# Patient Record
Sex: Male | Born: 1935 | Race: White | Hispanic: No | Marital: Married | Smoking: Never smoker
Health system: Southern US, Community
[De-identification: ages and names within clinical notes are randomized; demographics above are authoritative.]

## PROBLEM LIST (undated history)

## (undated) DIAGNOSIS — G252 Other specified forms of tremor: Secondary | ICD-10-CM

## (undated) DIAGNOSIS — F319 Bipolar disorder, unspecified: Secondary | ICD-10-CM

## (undated) DIAGNOSIS — R259 Unspecified abnormal involuntary movements: Principal | ICD-10-CM

## (undated) DIAGNOSIS — E039 Hypothyroidism, unspecified: Secondary | ICD-10-CM

## (undated) DIAGNOSIS — R413 Other amnesia: Secondary | ICD-10-CM

## (undated) DIAGNOSIS — I6350 Cerebral infarction due to unspecified occlusion or stenosis of unspecified cerebral artery: Secondary | ICD-10-CM

## (undated) DIAGNOSIS — F0390 Unspecified dementia without behavioral disturbance: Secondary | ICD-10-CM

## (undated) DIAGNOSIS — G219 Secondary parkinsonism, unspecified: Secondary | ICD-10-CM

## (undated) DIAGNOSIS — E7800 Pure hypercholesterolemia, unspecified: Secondary | ICD-10-CM

## (undated) HISTORY — DX: Pure hypercholesterolemia, unspecified: E78.00

## (undated) HISTORY — DX: Unspecified dementia, unspecified severity, without behavioral disturbance, psychotic disturbance, mood disturbance, and anxiety: F03.90

## (undated) HISTORY — DX: Secondary parkinsonism, unspecified: G21.9

## (undated) HISTORY — DX: Bipolar disorder, unspecified: F31.9

## (undated) HISTORY — DX: Other specified forms of tremor: G25.2

## (undated) HISTORY — DX: Cerebral infarction due to unspecified occlusion or stenosis of unspecified cerebral artery: I63.50

## (undated) HISTORY — PX: OTHER SURGICAL HISTORY: SHX169

## (undated) HISTORY — DX: Unspecified abnormal involuntary movements: R25.9

## (undated) HISTORY — DX: Hypothyroidism, unspecified: E03.9

## (undated) HISTORY — DX: Other amnesia: R41.3

---

## 1997-10-04 ENCOUNTER — Encounter (HOSPITAL_COMMUNITY): Admission: RE | Admit: 1997-10-04 | Discharge: 1998-01-02 | Payer: Self-pay

## 1997-11-22 ENCOUNTER — Ambulatory Visit (HOSPITAL_COMMUNITY): Admission: RE | Admit: 1997-11-22 | Discharge: 1997-11-22 | Payer: Self-pay | Admitting: *Deleted

## 1999-04-15 ENCOUNTER — Other Ambulatory Visit: Admission: RE | Admit: 1999-04-15 | Discharge: 1999-04-15 | Payer: Self-pay | Admitting: Urology

## 2001-03-30 ENCOUNTER — Encounter: Payer: Self-pay | Admitting: Internal Medicine

## 2001-03-30 ENCOUNTER — Encounter: Admission: RE | Admit: 2001-03-30 | Discharge: 2001-03-30 | Payer: Self-pay | Admitting: Internal Medicine

## 2001-04-15 ENCOUNTER — Encounter: Payer: Self-pay | Admitting: Emergency Medicine

## 2001-04-15 ENCOUNTER — Inpatient Hospital Stay (HOSPITAL_COMMUNITY): Admission: EM | Admit: 2001-04-15 | Discharge: 2001-04-18 | Payer: Self-pay | Admitting: Emergency Medicine

## 2001-04-17 ENCOUNTER — Encounter: Payer: Self-pay | Admitting: Internal Medicine

## 2001-04-28 ENCOUNTER — Encounter (INDEPENDENT_AMBULATORY_CARE_PROVIDER_SITE_OTHER): Payer: Self-pay | Admitting: Specialist

## 2001-04-28 ENCOUNTER — Ambulatory Visit (HOSPITAL_COMMUNITY): Admission: RE | Admit: 2001-04-28 | Discharge: 2001-04-28 | Payer: Self-pay | Admitting: Gastroenterology

## 2001-06-23 ENCOUNTER — Ambulatory Visit (HOSPITAL_COMMUNITY): Admission: RE | Admit: 2001-06-23 | Discharge: 2001-06-23 | Payer: Self-pay | Admitting: Gastroenterology

## 2003-01-01 ENCOUNTER — Inpatient Hospital Stay (HOSPITAL_COMMUNITY): Admission: EM | Admit: 2003-01-01 | Discharge: 2003-01-06 | Payer: Self-pay | Admitting: Psychiatry

## 2003-07-17 ENCOUNTER — Emergency Department (HOSPITAL_COMMUNITY): Admission: EM | Admit: 2003-07-17 | Discharge: 2003-07-17 | Payer: Self-pay | Admitting: Family Medicine

## 2003-08-16 ENCOUNTER — Ambulatory Visit (HOSPITAL_COMMUNITY): Admission: RE | Admit: 2003-08-16 | Discharge: 2003-08-16 | Payer: Self-pay | Admitting: Gastroenterology

## 2003-12-31 ENCOUNTER — Encounter: Admission: RE | Admit: 2003-12-31 | Discharge: 2003-12-31 | Payer: Self-pay | Admitting: Internal Medicine

## 2004-03-02 ENCOUNTER — Ambulatory Visit (HOSPITAL_COMMUNITY): Admission: RE | Admit: 2004-03-02 | Discharge: 2004-03-02 | Payer: Self-pay | Admitting: Gastroenterology

## 2005-05-20 ENCOUNTER — Ambulatory Visit (HOSPITAL_COMMUNITY): Admission: RE | Admit: 2005-05-20 | Discharge: 2005-05-20 | Payer: Self-pay | Admitting: Gastroenterology

## 2007-03-20 ENCOUNTER — Encounter: Admission: RE | Admit: 2007-03-20 | Discharge: 2007-03-20 | Payer: Self-pay | Admitting: Internal Medicine

## 2007-09-13 ENCOUNTER — Encounter: Admission: RE | Admit: 2007-09-13 | Discharge: 2007-09-13 | Payer: Self-pay | Admitting: Neurology

## 2010-11-06 NOTE — Discharge Summary (Signed)
[facility]  Patient:    Fernando Ruiz, Fernando Ruiz Visit Number: [id number] MRN: [id number]          Service Type: MED Location: [phone] Attending Physician:  Fernando Ruiz Dictated by:   Fernando Ruiz, M.D. Admit Date:  04/15/2001 Disc. Date: 04/18/01   CC:         Fernando Ruiz, M.D.  Fernando Ruiz, M.D.   Discharge Summary  DISCHARGE DIAGNOSES: 1. Lithium toxicity. 2. Acute renal failure on top of chronic renal insufficiency. 3. Bipolar disorder. 4. Hypertension. 5. Dysphagia with potential esophageal stricture. 6. Abnormal weight loss, possibly secondary to dysphagia. 7. Hypothyroidism.  DISCHARGE MEDICATIONS: 1. Plendil 2.5 mg p.o. q.d. for blood pressure. 2. Zyprexa 5 mg p.o. q.h.s. for bipolar disorder. 3. Synthroid 50 mcg p.o. q.d. for hypothyroidism. 4. Aspirin 81 mg p.o. q.d.  ACTIVITY:  Ambulate with assistance until steady on feet.  DIET:  Low salt.  WOUND CARE:  Not applicable.  SPECIAL INSTRUCTIONS:  The patient is to call Dr. Felipa Ruiz if he has any recurrent nausea or abdominal pain.  He is to follow up with Dr. Dub Ruiz or other psychiatrist for new patient evaluation for his bipolar disorder -- given that he was previously a patient of Dr. Hortencia Ruiz (who no longer takes his medical insurance).  FOLLOW-UP:  Two to three weeks with Dr. Felipa Ruiz, for which the patient and his wife is to call to make the appointment.  At that time he will need a BMET, lithium level.  LABORATORY DATA:  CBC (April 16, 2001) -- White blood cell count 9.9, hemoglobin 11.0, hematocrit 32%, platelet count 237.  ABG (on admission) -- pH 7.39, pCO2 38, pO2 67, bicarb 23, oxygen saturation 93% on room air. CHEM-7 (April 18, 2001) -- Sodium 150, potassium 4.7, chloride 121, serum CO2 21, BUN 16, creatinine 1.8, glucose 94.  (NOTE:  Sodium on April 17, 2001 was 145).  On admission the patients BUN was 19 and creatinine 2.0.  On admission amylase  135, lipase 56, AST 24, ALT 21, alkaline phosphatase 115, total bilirubin 1.6.  Recent calcium 10.1.  Urinalysis unremarkable for infection.  Blood cultures unremarkable for infection. Lithium level on April 17, 2001 was 1.75, with normal being 0.8 to 1.4. Lithium level on admission was elevated at 2.9, with same lab values as recorded above.  CHEST X-RAY:  (On admission)  Revealed tortuosity of the thoracic aorta, with atherosclerotic calcification of the arch.  Heart size is within normal limits.  Vague density in the right chest; believed to be superimposed to rib shadows, probable emphysema.  HISTORY OF PRESENT ILLNESS:  This is a 77 year old Caucasian male who has a history of bipolar disorder, followed by Dr. Hortencia Ruiz on lithium for numerous years.  He recently was initiated on Synthroid 15 mcg each day, earlier in October 2002 for newly diagnosed hyperthyroidism with an elevated TSH.  He also has a long history of hypertension, having been placed on Altace at 2.5 mg p.o. q.d. within the last one to two years.  The patient presented to the emergency room on April 15, 2001 with intractable nausea and vomiting and dehydration, lasting approximately three days.  He was initiated on intravenous fluids, which resulted in significant improvement in his symptomatology.  Lithium level was 2.9 and he was admitted for a lithium toxicity.  HOSPITAL COURSE:  The patient was administered IV fluids with significant resolution of his nausea, vomiting and abdominal pain; along withholding  his lithium treatment.  Confusion seen on admission quickly resolved.  ACE inhibitors also helped, given some mild elevation in his creatinine above baseline.  Baseline creatinine is known to be between 1.7 and 1.8.  On admission creatinine was 2.0.  There is no evidence of infection on admission. Over the next two to three days, all the patients symptomatology resolved. He was eating lunch on the day of  discharge without nausea or vomiting. Discussion with the patients wife indicates they are planning to call Dr. Dub Ruiz for a new patient evaluation, and prefer the follow up and management of his bipolar disorder.  The patient is also pending upper endoscopy with esophageal dilatation on April 28, 2001 with Dr. Madilyn Ruiz of gastroenterology. Prior to discharge the patients renal parameters stabilized at his normal baseline.  Given admission for lithium toxicity, possibly in the setting of recent initiation of Synthroid and longstanding use of ACE inhibitor, lithium was not restarted in place of Zyprexa at 5 mg p.o. q.h.s.  After a long discussion with the patients wife, it was decided that we would allow the new psychiatrist to determine whether or not to continue Zyprexa, in light of its significant expense versus reinitiating lithium therapy at a lower dose.  Of note, lithium dose prior to admission was 300 mg in the morning and 600 mg in the evening. Dictated by:   Fernando Ruiz, M.D. Attending Physician:  Fernando Ruiz DD:  04/18/01 TD:  04/18/01 Job: [id number]

## 2010-11-06 NOTE — Op Note (Signed)
[facility]  Patient:    Fernando Ruiz, Fernando Ruiz Visit Number: [id number] MRN: [id number]          Service Type: END Location: ENDO Attending Physician:  Louie Bun Dictated by:   Everardo All Madilyn Fireman, M.D. Proc. Date: 06/23/01 Admit Date:  06/23/2001   CC:         Ravi R. Felipa Eth, M.D.   Operative Report  PROCEDURE:  Esophagogastroduodenoscopy with esophageal dilatation.  INDICATION:  Solid food dysphagia suggestive of lower esophageal ring stricture.  DESCRIPTION OF PROCEDURE:  The patient was placed in the left lateral decubitus position on the pulse monitor with continuous low flow oxygen delivered by nasal cannula. He was sedated with 70 mg IV Demerol and 5.5 mg IV Versed. The Olympus video endoscope was advanced under direct vision to the oropharynx and the esophagus. The esophagus was straightened and of normal caliber at the squamocolumnar line at 38 cm. There was a lower esophageal ring that was not seen initially but when the scope was withdrawn for dilatation, it was clearly visible. There was no obvious hiatal hernia, stricture, Barretts esophagus or another abnormality to the GE junction. The stomach was entered and a small amount of liquid secretions were suctioned from the fundus. Retroflex view of the cardia was unremarkable. The fundus, body, antrum, and pylorus all appeared normal. The duodenum was entered and both bulb and second portion were well inspected and appeared to be within normal limits. Savary guidewire was placed through the endoscope channel and the scope withdrawn. Savary dilators of 16 mm and 17 mm diameter were passed with minimal resistance on the last dilator and a small amount of blood was seen on withdrawal. The patient was then returned to the recovery room in stable condition. He tolerated the procedure well and there were no immediate complications.  IMPRESSION:  Lower esophageal ring dilated to 17 mm.  PLAN:   Advance diet and observe response to dilatation. Dictated by:   Everardo All Madilyn Fireman, M.D. Attending Physician:  Louie Bun DD:  06/23/01 TD:  06/23/01 Job: [id number]

## 2010-11-06 NOTE — Op Note (Signed)
NAME:  Fernando Ruiz, Fernando Ruiz                          ACCOUNT NO.:  [id number]   MEDICAL RECORD NO.:  [id number]                   PATIENT TYPE:  AMB   LOCATION:  ENDO                                 [facility]ACILITY[facility]  [facility]   PHYSICIAN:  John C. Madilyn Fireman, M.D.                 DATE OF BIRTH:  06-29-1930   DATE OF PROCEDURE:  03/02/2004  DATE OF DISCHARGE:                                 OPERATIVE REPORT   PROCEDURE:  Esophagogastroduodenoscopy.   INDICATION FOR PROCEDURE:  Dysphagia with a known esophageal ring responding  to dilatation in the past.   DESCRIPTION OF PROCEDURE:  The patient was placed in the left lateral  decubitus position and placed on the pulse monitor with continuous low-flow  oxygen delivered by nasal cannula.  He was sedated with 75 mcg IV fentanyl  and 7 mg IV Versed.  The Olympus video endoscope was advanced under direct  vision into the oropharynx and esophagus.  The esophagus was straight and of  normal caliber with the squamocolumnar line at 38 cm above a small hiatal  hernia.  There was a well circumscribed ring at the lower esophageal  sphincter with no resistance to passage of the scope beyond it.  The stomach  was entered, and a small amount of liquid secretions were suctioned from the  fundus.  Retroflexed view of the cardia was unremarkable.  The fundus, body,  antrum, and pylorus all appeared normal.  The duodenum was entered, and both  the bulb and second portion were well-inspected and appeared to be within  normal limits.  A Savary dilator was passed through the endoscope channel  and the scope withdrawn.  Savary dilators of 18 and 19 mm were passed over  the guidewire with minimal resistance and no blood seen on withdrawal.  The  last dilator was removed together with the wire, and the patient returned to  the recovery room in stable condition.  He tolerated the procedure well, and  there were no immediate complications.   IMPRESSION:  Lower esophageal ring,  status post dilatation to 19 mm.   PLAN:  Advance diet and observe response to dilatation.                                               John C. Madilyn Fireman, M.D.    JCH/MEDQ  D:  03/02/2004  T:  03/02/2004  Job:  [id number]   cc:   Larina Earthly, M.D.  [address]  [place]  [place] [address]  Fax: [phone]

## 2010-11-06 NOTE — H&P (Signed)
NAME:  Fernando Ruiz, Fernando Ruiz NO.:  [id number]   MEDICAL RECORD NO.:  [id number]                   PATIENT TYPE:  IPS   LOCATION:  0403                                 FACILITY:  [facility]   PHYSICIAN:  Geoffery Lyons, M.D.                   DATE OF BIRTH:  06/09/1931   DATE OF ADMISSION:  01/01/2003  DATE OF DISCHARGE:                         PSYCHIATRIC ADMISSION ASSESSMENT   IDENTIFYING INFORMATION:  This is a 77 year old white male who is married.  This is a voluntary admission.   HISTORY OF PRESENT ILLNESS:  This patient was referred by his primary care  practitioner today for manic behavior.  The patient describes that he has  lost 20 or 30 pounds since February, had progressive problems with not being  able to sleep at night, and describes himself as a perfectionist.  He most  recently reports that he has been unable to handle his wife, and he seems  quite irritable talking about her today, and it becomes clear, through the  conversation and his rapid thought process, that he had left home apparently  for two or three days at a time twice in the past one week.  He has also  apparently been arguing with his wife and also talks about how much he has  been riding his bike recently.  He reports he is unable to sleep, but the  rest of his history is somewhat disconnected and disorganized.  He is  generally an unreliable historian, and we have been unable to contact his  wife this morning.  He denies any suicidal or homicidal ideations when asked  directly.  At the time of admission, patient presented himself to the  hospital in referral from his primary care physician, whom he had phoned  about his behavior.  He was accompanied by his wife and was generally manic  and disorganized throughout the assessment process.  He told a story of  driving [place] [address] to see a friend and being pulled over by police and  getting lost repeatedly.  Today, his speech is  pressured, he is hyperverbal  and disorganized and tangential in speech.   PAST PSYCHIATRIC HISTORY:  Patient is followed by Dr. [name] since 2002.  This is his first admission to [facility] with his  last admission being for mania approximately 15 years ago by his own report,  and that was at [facility].  Patient reports more than seven admissions  in the distant past for mania to [facility] system throughout his  entire life.  In the past, he had taken lithium for the longest period of  time, which he reports controlled his mania very well, took this for more  than 20 years and then developed some toxicity in October 2002 along with  some mild renal insufficiency and apparently has been off it since that  time  and has not restarted any other medications.  He denies ever taking  Depakote, and the only medication he has been taking recently is a vitamin D  pill.  Prior suicidal thought or attempts are not clear.   SOCIAL HISTORY:  Patient is currently married and lives with his wife here  in [place].  He is retired from full-time employment, and the nature of  his work is not clear, although it seems that he had worked for a [employer].  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  He has no  current legal charges.   FAMILY HISTORY:  Not clear.   ALCOHOL AND DRUG HISTORY:  Patient denies.   PAST MEDICAL HISTORY:  Patient is followed by Dr. Felipa Eth for internal medicine  concerns.  Medical problems have included:  1. Some mild renal insufficiency.  2. Hypertension.  3. Past medical history, as previously noted, patient has a history of     lithium toxicity, diagnosed in October 2002, for which he was     hospitalized at that time.  4. He also apparently has a history of an esophageal stricture and has had     previous dilatation procedures.   MEDICATIONS:  1. Vitamin D.  2. A blood pressure pill.  3. A sleeping pill.  Patient  is unable to name his medications.  A telephone call to Dr. Vicente Males  office notes that patient's medications have been faxed over, and they are:  Klonopin 0.5 mg one to two tablets at h.s.; vitamin D, dose unclear;  Prevacid 30 mg daily; ASA 81 mg daily; and Altace 2.5 mg daily.   DRUG ALLERGIES:  1. PENICILLIN.  2. REMERON has apparently caused him in the past to have frequent falls and     made him feel drunk.   REVIEW OF SYSTEMS:  Today, review of systems is remarkable for the patient  reporting a 20- to 30-pound weight loss sine February.  He reports his  baseline weight being approximately 188 pounds.  He reports being constantly  hyper with a poor appetite and broken sleep.   POSITIVE PHYSICAL FINDINGS:  GENERAL:  This is a thin, almost cachectic-  appearing white male who is in no acute distress.  VITAL SIGNS:  His vital signs at the time of admission were a temp of a  97.8, pulse 76, respirations 20, blood pressure 148/95.  This morning, he  weighs 125 pounds and is exactly 5 feet 7-1/2 inches tall for a BMI of 19.5.  SKIN:  Pale in tone.  He does have some superficial scratches on his left  lower leg, some on the inner aspect of his right leg; these appear to be  healing, possibly just from [place] or walking.  HEENT:  Head is normocephalic and atraumatic.  EENT, PERRLA.  Patient does  wear corrective lenses.  Sclerae are nonicteric.  No rhinorrhea.  Oropharynx  not injected.  The patient does have an upper plate of dentures in place.  NECK:  Supple.  No thyromegaly.  CARDIOVASCULAR:  S1 and S2 are heard.  No clicks, murmurs, or gallops.  LUNGS:  Clear to auscultation.  ABDOMEN:  Flat, soft, nontender.  No mass is appreciated.  GENITALIA:  Deferred.  MUSCULOSKELETAL:  No swelling or erythema of any joints.  NEURO:  EOMs are intact.  No nystagmus is noted.  Patient's balance is good. He is unable to hold still for a full adequate cranial nerve evaluation; he  is unable to  be still enough to follow commands.  His gait is steady.  Motor  movements smooth.  Sensory grossly intact.  Grip strength equal bilaterally.  Facial symmetry is present.  No focal findings are noted.   LABORATORY DATA:  Diagnostic studies were actually mostly within normal  limits.  His creatinine was 1.7 and BUN 25.  Electrolytes were normal.  CBC  was normal.  Thyroid panel is pending.  The patient's SGOT is 81, SGPT is  57; other liver enzymes within normal limits.   MENTAL STATUS EXAM:  This is a fully alert male with a mildly anxious affect  and hyper motor behavior.  He is able to sit still for a few minutes but  then has to get up and move about the room.  His speech is mildly pressured  and hyperverbal.  He prefers to be pacing up and down the hall.  He is  directible with some effort and some caution because he is quite irritable.  Mood is anxious and irritable.  Thought process reflects flight of ideas,  tangential thinking.  He is quite disorganized with rapid change in the  thoughts, and he is unable to keep track of the conversation.  His thinking  is fragmented.  No evidence of overt hallucinations.  Suicidal or homicidal  ideation not present.  Cognitively, he is intact and oriented times three.  Intelligence is average to above average.  Insight poor.  Judgment and  impulse control are impaired.    DIAGNOSES:   AXIS I:  Bipolar disorder, manic.   AXIS II:  No diagnosis.   AXIS III:  1. Elevated liver enzymes.  2. Elevated blood pressure.  3. Mild renal insufficiency.   AXIS IV:  Patient apparently does admit to having a supportive wife, and  this is an asset to him.   AXIS V:  1. Current 16.  2. Past year 72.   PLAN:  To voluntarily admit the patient with every 15-minute checks in  place.  We have placed him on our intensive care program.  We have elected  to start him on Zyprexa 2.5 mg p.o. b.i.d. and 5 mg p.o. q.h.s., and we will  avoid lithium because  of his history of toxicity and mild renal  insufficiency.  Meanwhile, we will restart his routine meds, according to  Dr. Vicente Males list, and monitor his blood pressure closely, and we will recheck  a BMET on him in light of his mild renal insufficiency.  Meanwhile, we are  going to start him also on Klonopin 0.5 mg p.o. q.a.m. and 1 mg p.o. q.h.s.,  and we will weigh him daily.  We are also going to do repeat liver enzymes  and hepatitis panel.   ESTIMATED LENGTH OF STAY:  Five days.     Margaret A. Scott, N.P.                   Geoffery Lyons, M.D.    MAS/MEDQ  D:  01/03/2003  T:  01/05/2003  Job:  [id number]

## 2010-11-06 NOTE — Procedures (Signed)
[facility]  Patient:    Fernando Ruiz, Fernando Ruiz Visit Number: [id number] MRN: [id number]          Service Type: END Location: ENDO Attending Physician:  Louie Bun Dictated by:   Everardo All Madilyn Fireman, M.D. Proc. Date: 04/28/01 Admit Date:  04/28/2001   CC:         Ravi R. Felipa Eth, M.D.   Procedure Report  PROCEDURE:  Colonoscopy.  SURGEON:  John C. Madilyn Fireman, M.D.  INDICATIONS FOR PROCEDURE:  Screening colonoscopy with associated weight loss in a patient also undergoing EGD due to dysphagia.  DESCRIPTION OF PROCEDURE:  The patient was placed in the left lateral decubitus position and placed on the pulse monitor with continuous low flow oxygen delivered by nasal cannula.  He was sedated with 70 mg of IV Demerol and 7 mg of IV Versed for the previous EGD, and no further sedation was required for this procedure.  The Olympus video colonoscope was inserted into the rectum and advanced to the cecum, confirmed by transillumination at McBurneys point, and visualization of the ileocecal valve and appendiceal orifice.  The prep was fairly good.  The cecum, ascending, transverse, and descending colon all appeared normal with no masses, polyps, diverticula, or other mucosal abnormalities, though within the sigmoid colon, there were seen a few scattered diverticula, and no other abnormalities.  The rectum appeared normal.  On retroflexed view, the anus revealed small internal hemorrhoids. The colonoscope was then withdrawn and the patient returned to the recovery room in stable condition.  He tolerated the procedure well and there were no immediate complications.  IMPRESSION:  Left-sided diverticulosis, otherwise normal colonoscopy. Dictated by:   Everardo All Madilyn Fireman, M.D. Attending Physician:  Louie Bun DD:  04/28/01 TD:  04/30/01 Job: [id number]

## 2010-11-06 NOTE — Op Note (Signed)
NAME:  Fernando Ruiz, Fernando Ruiz                ACCOUNT NO.:  [id number]   MEDICAL RECORD NO.:  [id number]          PATIENT TYPE:  AMB   LOCATION:  ENDO                         FACILITY[facility]  [facility]   PHYSICIAN:  John C. Madilyn Fireman, M.D.    DATE OF BIRTH:  1931-01-15   DATE OF PROCEDURE:  05/20/2005  DATE OF DISCHARGE:                                 OPERATIVE REPORT   PROCEDURE:  Esophagogastroduodenoscopy with esophageal dilatation.   INDICATIONS FOR PROCEDURE:  Recurrent solid food dysphagia with history of  lower esophageal ring, symptom response to dilatation to 19 mm 1 year ago.   DESCRIPTION OF PROCEDURE:  The patient was placed in the left lateral  decubitus position then placed on the pulse monitor with continuous low-flow  oxygen delivered by nasal cannula. He was sedated with 75 mcg IV fentanyl  and 7.5 mg IV Versed. The Olympus video endoscope was advanced under direct  vision into the oropharynx and esophagus. The esophagus was straight and of  normal caliber with the squamocolumnar line at 38 cm. There was a widely  patent lower esophageal ring that could only be seen when the LES was fully  relaxed. There was no visible esophagitis and no definite hiatal hernia. The  stomach was entered and a small amount of liquid secretions were suctioned  from the fundus. Retroflexed view of the cardia was unremarkable. The  fundus, body, antrum and pylorus all appeared normal. The duodenum was  entered and both the bulb and second portion were well inspected and  appeared to be within normal limits. The Savary guidewire was placed through  the endoscope channel and the scope withdrawn. Savary dilators of 19 and 20  mm were passed over the guidewire with mild resistance and no blood seen on  withdrawal. The last dilator was removed together with wire and the patient  returned to the recovery room in stable condition. He tolerated the  procedure well and there were no immediate complications.   IMPRESSION:  Widely patent lower esophageal ring dilated to 20 mm.   PLAN:  Will advance diet and observe response to dilatation.           ______________________________  Everardo All Madilyn Fireman, M.D.     JCH/MEDQ  D:  05/20/2005  T:  05/20/2005  Job:  [id number]   cc:   Larina Earthly, M.D.  Fax: [phone]

## 2010-11-06 NOTE — H&P (Signed)
[place]  Patient:    Fernando Ruiz, Fernando Ruiz Visit Number: [id number] MRN: [id number]          Service Type: MED Location: [id number] Attending Physician:  Hoyle Sauer Dictated by:   Gwen Pounds, M.D. Admit Date:  04/15/2001   CC:         Ravi R. Felipa Eth, M.D.  Denman George, M.D.   History and Physical  DATE OF BIRTH:  [birth year]  CHIEF COMPLAINT:  Nausea, vomiting, and Lithium toxicity.  HISTORY OF PRESENT ILLNESS:  This is a 77 year old male with bipolar disorder on Lithium, who came into the emergency department with three days of intractable nausea and vomiting and dehydration.  After arriving at the ED, he currently feels a lot better, still with some mild confusion.  He is to be started on an IV.  Workup has so far been negative.  His belly is relatively benign.  I was called to come admit him with a Lithium level of 2.9.  PAST MEDICAL HISTORY: 1. Bipolar disorder. 2. Hypothyroid. 3. Hypertension. 4. History of long-standing tobacco use, now quit.  MEDICATION LIST: 1. Lithium. 2. Synthroid 50. 3. Altace 2.5.  ALLERGIES:  PENICILLIN.  SOCIAL HISTORY:  He lives with his wife for 43 years, retired from [employer].  He has one child.  He quit tobacco five years ago.  No alcohol.  FAMILY HISTORY:  Brother with head and neck cancer.  Mother died at the age of 24 of stomach cancer.  REVIEW OF SYSTEMS:  He denies any fever or chills.  He has been having nausea and vomiting and abdominal soreness, but no true abdominal pain.  It is diffuse.  He normally has some diarrhea and some GI issues.  He has recently seen Dr. Madilyn Fireman from gastroenterology, who has him planned to do an endoscopy and dilatation soon.  His last manic episode was in 1988 for which he needed to be hospitalized.  He was a little confused prior to starting the nausea and vomiting.  He denies melena and bright red blood per rectum, hematemesis, chest pain,  shortness of breath.  He denies being hard of hearing.  He does wear glasses.  He is currently a little bit weak.  He recently started on Synthroid approximately three weeks ago.  He denies diabetes.  He wears an upper plate.  He lost 14 pounds from May to October.  Other organ systems are negative.  PHYSICAL EXAMINATION:  VITAL SIGNS:  Temperature 98.5, blood pressure 113/56, heart rate 62, respiratory rate 20, saturation 97% room air.  GENERAL:  Alert and oriented x 3 with prompting, otherwise is mildly confused.  HEENT:  PERRL, EOMI, oropharynx is very dry.  NECK:  Supple.  No JVD.  SKIN:  Pale, no lymphadenopathy.  PULMONARY:  Clear to auscultation bilaterally.  CARDIAC:  Regular without murmurs.  ABDOMEN:  Thin, soft, nontender, nondistended, bowel sounds positive.  No rebound, no guarding.  EXTREMITIES:  No clubbing, no cyanosis, no edema.  Dorsalis pedis pulses 2+. Strength intact.  LABORATORY:  Urinalysis is negative.  White blood cell count is 10,000 with 86% segs, hemoglobin 11.5, platelet count 261.  Sodium 139, potassium 5.0, chloride 109, bicarb 23, BUN 19, creatinine 2.0, glucose 91.  The pH is 7.46, PCO2 30, PO2 67.  EKG shows normal sinus rhythm and plenty of artifact, leaving me to say that he has got nonspecific ST and T-wave changes.  Acute abdominal series  shows mild emphysematous changes, nothing acute noted in his abdomen.  His Lithium level was 2.9, upper limit of normal is 1.4.  IMPRESSION:  This is a 77 year old male who is relatively thin with a recent weight loss, who has been in his usual state of health until recently, when he developed three days of intractable nausea and vomiting, along with pretty significant dehydration.  He presented to the emergency department with renal insufficiency and Lithium toxicity.  PLAN:  1. Admit to Dr. Felipa Eth.  2. Telemetry.  3. Aggressively hydrate.  4. Hold Lithium.  5. Phenergan p.r.n.  6. Continue the  Synthroid.  7. Hold Altace secondary to the renal insufficiency.  8. Find out his baseline creatinine.  Hopefully his creatinine returns to     normal without aggressive hydration.  9. Check LFTs. 10. Check amylase and lipase. 11. Check blood cultures. 12. Consult psych when doing better if necessary. 13. Early emphysematous changes on chest x-ray.  It is a good thing that he     stopped tobacco when he did. Dictated by:   Gwen Pounds, M.D. Attending Physician:  Hoyle Sauer DD:  04/15/01 TD:  04/16/01 Job: [id number]

## 2010-11-06 NOTE — Discharge Summary (Signed)
NAME:  Fernando Ruiz, Fernando Ruiz                          ACCOUNT NO.:  [id number]   MEDICAL RECORD NO.:  [id number]                   PATIENT TYPE:  IPS   LOCATION:  0403                                 FACILITY:  [facility]   PHYSICIAN:  Geoffery Lyons, M.D.                   DATE OF BIRTH:  08-03-1930   DATE OF ADMISSION:  01/01/2003  DATE OF DISCHARGE:  01/06/2003                                 DISCHARGE SUMMARY   CHIEF COMPLAINT AND PRESENTING ILLNESS:  This was the first admission to  [facility]  for this 77 year old white male, married,  voluntarily admitted.  Referred by primary care physician for manic  behavior.  He left home for 2 or 3 days, twice in the past week, has been  arguing with his wife, riding his bike.  Lost 20-30 pounds since February,  cannot sleep.  Denies suicidal or homicidal ideation.   PAST PSYCHIATRIC HISTORY:  [name] since October 2002.  First time  [facility].  Last admission for mania was 16 years ago at the  [facility].   ALCOHOL AND DRUG HISTORY:  Denies the use or abuse of any substances.   PAST MEDICAL HISTORY:  Mild renal insufficiency, arterial hypertension.   MEDICATIONS:  Blood pressure medication, sleeping medication, Vitamin E.   PHYSICAL EXAMINATION:  Performed, failed to show any acute findings.   MENTAL STATUS EXAM:  Reveals a fully alert, hypermobile, almost cachectic  male.  Hyperverbal, pressured speech.  Mood of anxiety, irritable.  Affect  irritable and anxious.  Thought process circumstantial, disorganized, rapid  pace.  Fragmented thinking.  Cognition well preserved.   ADMISSION DIAGNOSES:   AXIS I:  Bipolar disorder, manic.   AXIS II:  No diagnosis.   AXIS III:  Elevated liver enzymes and elevated blood pressure.   AXIS IV:  Moderate.   AXIS V:  Global assessment of function upon admission 20, highest global  assessment of function in past year 65-70.   LABORATORY DATA:  CBC was within normal  limits.  Blood chemistries were  within normal limits.  SGOT on July 13 81, SGPT 67, on July 16 SGOT 35, SGPT  40.   COURSE IN HOSPITAL:  He was admitted and started on intensive individual and  group psychotherapy.  He was initially given Zyprexa as well as some Ativan  at bedtime.  He was placed on Altace 2.5 mg per day.  He continued to be  prescribed Zyprexa 2.5 twice a day and 5 at night.  Klonopin 0.5 in the  morning and 1 mg at night.  Once he was given the Zyprexa he started  sleeping better.  There was some initial pressured speech, hyperthymic,  responsive, hyper-religious, but started sleeping.  There was some  impulsivity requiring redirection, and expansiveness.  He continued to  settle down.  He was to meet with his wife.  The session with his wife went  well.  He continued to settle down and on July 18 he was much improved, in  full contact with reality, no suicidal ideas, no homicidal ideas, no  delusions, no hallucinations, no aggressive behaviors, no agitation,  sleeping well, increased insight, willing to stay on his medication.   DISCHARGE DIAGNOSES:   AXIS I:  Bipolar disorder, manic.   AXIS II:  No diagnosis.   AXIS III:  Elevated liver enzymes, high blood pressure.   AXIS IV:  Moderate.   AXIS V:  Global assessment of function upon discharge 60.   DISCHARGE MEDICATIONS:  1. Zyprexa 2.5 mg 1 twice a day and Zyprexa Zydis 5 at bedtime.  2. Klonopin 0.5 1 in the morning and 2 at night.  3. Aspirin 81 mg daily.  4. Protonix 40 mg daily.   DISPOSITION:  Follow up with Dr. Kathrynn Running.                                                Geoffery Lyons, M.D.    IL/MEDQ  D:  01/30/2003  T:  01/30/2003  Job:  [id number]

## 2010-11-06 NOTE — Procedures (Signed)
[facility]  Patient:    Fernando Ruiz, Fernando Ruiz Visit Number: [id number] MRN: [id number]          Service Type: END Location: ENDO Attending Physician:  Louie Bun Dictated by:   Everardo All Madilyn Fireman, M.D. Proc. Date: 04/28/01 Admit Date:  04/28/2001   CC:         Ravi R. Felipa Eth, M.D.   Procedure Report  PROCEDURE:  Esophagogastroduodenoscopy with biopsy.  SURGEON:  John C. Madilyn Fireman, M.D.  INDICATIONS FOR PROCEDURE:  Dysphagia and weight loss.  DESCRIPTION OF PROCEDURE:  The patient was placed in the left lateral decubitus position and placed on the pulse monitor with continuous low flow oxygen delivered by nasal cannula.  He was sedated with 70 mg of IV Demerol and 7 mg of IV Versed.  The Olympus video endoscope was advanced under direct vision into the oropharynx and esophagus.  The esophagus was straight and of normal caliber with the squamocolumnar line at 38 cm.  There was diffuse erythema loosely adherent exudate and loose adherent exudate consistent with esophagitis throughout most of the distal esophagus.  Also, just below the squamocolumnar line, the epithelium just beneath it appeared somewhat erythematous and granular, and there was one nodular area that was at least mildly suspicious for neoplasm.  It was biopsied three times.  I elected not to pursue dilatation.  I did not see a stricture or ring.  The stomach was entered and a small amount of liquid secretions were suctioned from the fundus.  Retroflexed view of the cardia was unremarkable and revealed no nodule or mass.  The fundus, body, antrum, and pylorus all appeared normal. The duodenum was entered, and both the bulb and second portion were well-inspected and appeared to be within normal limits.  The scope was then withdrawn and the patient returned to the recovery room in stable condition.  He tolerated the procedure well and there were no immediate complications.  IMPRESSION: 1.  Severe diffuse erosive esophagitis. 2. Nodularity of the gastroesophageal junction, rule out neoplasm.  PLAN:  Await biopsy results and assuming no malignancy found, the patient will need repeat EGD and consideration of dilatation, and repeat biopsy in approximately one to two months after proton pump inhibitor. Dictated by:   Everardo All Madilyn Fireman, M.D. Attending Physician:  Louie Bun DD:  04/28/01 TD:  04/30/01 Job: [id number]

## 2010-11-06 NOTE — Op Note (Signed)
NAME:  Fernando Ruiz, Fernando Ruiz                          ACCOUNT NO.:  [id number]   MEDICAL RECORD NO.:  [id number]                   PATIENT TYPE:  AMB   LOCATION:  ENDO                                 [facility]ACILITY[facility]  [facility]   PHYSICIAN:  John C. Madilyn Fireman, M.D.                 DATE OF BIRTH:  06/27/1930   DATE OF PROCEDURE:  08/16/2003  DATE OF DISCHARGE:                                 OPERATIVE REPORT   PROCEDURE:  Esophagogastroduodenoscopy with esophageal dilatation.   INDICATIONS:  History of lower esophageal ring with recurrent dysphagia.   DESCRIPTION OF PROCEDURE:  The patient was placed in the left lateral  decubitus position and placed on the pulse monitor with continuous low-flow  oxygen delivered by nasal cannula.  He was sedated with 75 mcg IV fentanyl  and 7 mg IV Versed.  The video endoscope was advanced under direct vision  into the oropharynx and esophagus.  The esophagus was straight and of normal  caliber, the squamocolumnar line at 38 cm.  I could not distinctly see any  lower esophageal ring but there were two or three erosions with a small  amount of exudate extending 2-3 cm above the GE junction.  The stomach was  entered and a small amount of liquid secretions were suctioned from the  fundus.  Retroflexed view of the cardia was unremarkable.  The fundus, body,  antrum, and pylorus all appeared normal.  The duodenum was entered and both  bulb and second portion were well inspected and appeared to be within normal  limits.   The Savary guide wire was placed through the endoscope channel and the scope  withdrawn.  Savary dilators, 17 and 18 mm, were passed consecutively over  the guide wire with no blood seen on either dilator.  The last dilator was  removed together with the wire.  The patient returned to the recovery room  in stable condition.  He tolerated the procedure well and there were no  immediate complications.   IMPRESSION:  Presumed lower esophageal ring with mild  esophagitis, status  post dilatation to 18 mm.   PLAN:  Advance diet and observe response to dilatation.  Will treat with  proton pump inhibitor for at least two months.                                               John C. Madilyn Fireman, M.D.    JCH/MEDQ  D:  08/16/2003  T:  08/16/2003  Job:  [id number]   cc:   Larina Earthly, M.D.  [address]  [place]  [place] [address]  Fax: [phone]

## 2010-12-16 ENCOUNTER — Encounter: Payer: Self-pay | Admitting: Podiatry

## 2012-11-01 ENCOUNTER — Ambulatory Visit (INDEPENDENT_AMBULATORY_CARE_PROVIDER_SITE_OTHER): Payer: Medicare Other | Admitting: Nurse Practitioner

## 2012-11-01 ENCOUNTER — Encounter: Payer: Self-pay | Admitting: Nurse Practitioner

## 2012-11-01 VITALS — BP 138/88 | HR 70 | Ht 66.0 in | Wt 147.0 lb

## 2012-11-01 DIAGNOSIS — G219 Secondary parkinsonism, unspecified: Secondary | ICD-10-CM

## 2012-11-01 DIAGNOSIS — F0390 Unspecified dementia without behavioral disturbance: Secondary | ICD-10-CM

## 2012-11-01 DIAGNOSIS — G252 Other specified forms of tremor: Secondary | ICD-10-CM

## 2012-11-01 DIAGNOSIS — R259 Unspecified abnormal involuntary movements: Secondary | ICD-10-CM

## 2012-11-01 DIAGNOSIS — F319 Bipolar disorder, unspecified: Secondary | ICD-10-CM

## 2012-11-01 DIAGNOSIS — I6350 Cerebral infarction due to unspecified occlusion or stenosis of unspecified cerebral artery: Secondary | ICD-10-CM

## 2012-11-01 DIAGNOSIS — E039 Hypothyroidism, unspecified: Secondary | ICD-10-CM

## 2012-11-01 DIAGNOSIS — E7800 Pure hypercholesterolemia, unspecified: Secondary | ICD-10-CM

## 2012-11-01 MED ORDER — DONEPEZIL HCL 10 MG PO TABS: 10.0000 mg | ORAL_TABLET | Freq: Every morning | ORAL | Status: DC

## 2012-11-01 MED ORDER — TRIHEXYPHENIDYL HCL 2 MG PO TABS: 2.0000 mg | ORAL_TABLET | Freq: Every day | ORAL | Status: DC

## 2012-11-01 NOTE — Patient Instructions (Addendum)
Will change Aricept to 10 mg tablet daily with next refill Continue Artane at the current dose Followup in one year or sooner problems arise

## 2012-11-01 NOTE — Progress Notes (Signed)
HPI: The patient is a 79 y.o. year old male who has had memory issues for about five years.  The patient does not do the finances in the home.  The patient does not drive.  The patient is  able to perform own ADL's.  The patient is unable to distribute his own medications.  The patients bladder and bowel are under good control.  There have been no behavioral changes in the last year.   There have been no hallucinations. His memory is currently stable. His appetite and weight are stable. He sleeps well at night. He is seen by Johnella Moloney nurse practitioner for bipolar disorder every 3 months   ROS:  Memory loss, fatigue  Physical Exam General: well developed, well nourished, seated, in no evident distress Head: head normocephalic and atraumatic. Oropharynx benign Neck: supple with no carotid or supraclavicular bruits Cardiovascular: regular rate and rhythm, no murmurs  Neurologic Exam Mental Status: Awake and  alert. MMSE 20/30, missing items in orientation calculation and copying a figure. He did not miss recall items. AFT 4.   Cranial Nerves: Fundoscopic exam reveals sharp disc margins. Pupils equal, briskly reactive to light. Extraocular movements full without nystagmus. Visual fields full to confrontation. Hearing intact and symmetric to finger snap. Facial sensation intact. Face, tongue, palate move normally and symmetrically. Facial dyskinesias noted .  Motor: Normal bulk and tone. Normal strength in all tested extremity muscles. No focal weakness. Mild resting tremor of the left hand, mild cogwheeling of the right wrist Sensory.: intact to touch and pinprick and vibratory.  Coordination: Rapid alternating movements normal in all extremities. Gait and Station: Arises from chair with the use of his arms .  Gait demonstrates shortened stride length but no shuffling, loss of right arm swing. No fragmented turns .  Reflexes: 1+ and symmetric. Toes downgoing.     ASSESSMENT: Secondary  Parkinson's disease, memory loss which is stable, and bipolar disorder     PLAN: Will change Aricept to 10 mg tablet daily with next refill Continue Artane at the current dose Followup in one year or sooner problems arise    Nilda Riggs, GNP-BC APRN

## 2012-11-15 ENCOUNTER — Encounter: Payer: Self-pay | Admitting: Neurology

## 2012-12-16 ENCOUNTER — Other Ambulatory Visit: Payer: Self-pay | Admitting: Neurology

## 2012-12-21 ENCOUNTER — Other Ambulatory Visit: Payer: Self-pay | Admitting: Neurology

## 2013-01-31 ENCOUNTER — Ambulatory Visit
Admission: RE | Admit: 2013-01-31 | Discharge: 2013-01-31 | Disposition: A | Discharge status: Home / Self Care | Payer: Medicare Other | Source: Ambulatory Visit | Attending: Cardiology | Admitting: Cardiology

## 2013-01-31 ENCOUNTER — Other Ambulatory Visit: Payer: Self-pay | Admitting: Cardiology

## 2013-01-31 DIAGNOSIS — R0602 Shortness of breath: Secondary | ICD-10-CM

## 2013-05-09 ENCOUNTER — Telehealth: Payer: Self-pay | Admitting: Neurology

## 2013-05-09 NOTE — Telephone Encounter (Signed)
called patient to reschedule appt per yan, couldn't reach patient, left message to call back and reschedule. °

## 2013-06-18 ENCOUNTER — Encounter: Payer: Self-pay | Admitting: Podiatry

## 2013-06-18 ENCOUNTER — Ambulatory Visit (INDEPENDENT_AMBULATORY_CARE_PROVIDER_SITE_OTHER): Payer: Medicare Other | Admitting: Podiatry

## 2013-06-18 VITALS — BP 126/77 | HR 69 | Resp 12

## 2013-06-18 DIAGNOSIS — M79609 Pain in unspecified limb: Secondary | ICD-10-CM

## 2013-06-18 DIAGNOSIS — B351 Tinea unguium: Secondary | ICD-10-CM

## 2013-06-18 NOTE — Progress Notes (Signed)
Subjective:     Patient ID: Fernando Ruiz, male   DOB: 29-Dec-1930, 79 y.o.   MRN: [id number]  HPI patient has nail disease with pain 1-5 both feet   Review of Systems     Objective:   Physical Exam Neurovascular status unchanged with thick painful nail bed 1-5 both feet    Assessment:     Mycotic nail infection with pain 1-5 both feet    Plan:     Debridement painful nailbeds 1-5 both feet with no iatrogenic bleeding noted

## 2013-07-05 ENCOUNTER — Ambulatory Visit: Payer: Medicare Other | Admitting: Podiatry

## 2013-07-19 ENCOUNTER — Ambulatory Visit
Admission: RE | Admit: 2013-07-19 | Discharge: 2013-07-19 | Disposition: A | Discharge status: Home / Self Care | Payer: Medicare Other | Source: Ambulatory Visit | Attending: Internal Medicine | Admitting: Internal Medicine

## 2013-07-19 ENCOUNTER — Other Ambulatory Visit: Payer: Self-pay | Admitting: Internal Medicine

## 2013-07-19 DIAGNOSIS — M25559 Pain in unspecified hip: Secondary | ICD-10-CM

## 2013-07-26 ENCOUNTER — Other Ambulatory Visit: Payer: Self-pay | Admitting: Dermatology

## 2013-08-17 ENCOUNTER — Telehealth: Payer: Self-pay | Admitting: Neurology

## 2013-08-17 NOTE — Telephone Encounter (Signed)
Pt's wife Vena Austrialeanor called states pt needs a letter stating pt is not able to drive and is under the care of his wife.The letter can [facility], [employer] fax # [phone]. Vena Austrialeanor would like for Dr. Oliva Bustardohmeier's nurse to return her call when this is completed or if you have any ?'s for her.

## 2013-08-20 NOTE — Telephone Encounter (Signed)
Patient's wife is calling stating that she needs a letter written stating that the patient is unable to drive and is under the wife's care. Dr. Vickey Hugerohmeier would you like a letter to be written. Please advise.

## 2013-08-21 NOTE — Telephone Encounter (Signed)
Please write this letter and I will sign . CD

## 2013-08-23 ENCOUNTER — Encounter: Payer: Self-pay | Admitting: Neurology

## 2013-08-23 NOTE — Telephone Encounter (Signed)
Letter has been written, signed and faxed to insurance agency.  Wife also asked for a copy to be mailed to her.

## 2013-09-10 ENCOUNTER — Ambulatory Visit (INDEPENDENT_AMBULATORY_CARE_PROVIDER_SITE_OTHER): Payer: Medicare Other | Admitting: Podiatry

## 2013-09-10 ENCOUNTER — Encounter: Payer: Self-pay | Admitting: Podiatry

## 2013-09-10 VITALS — BP 140/86 | HR 76 | Resp 12

## 2013-09-10 DIAGNOSIS — M79609 Pain in unspecified limb: Secondary | ICD-10-CM

## 2013-09-10 DIAGNOSIS — B351 Tinea unguium: Secondary | ICD-10-CM

## 2013-09-11 NOTE — Progress Notes (Signed)
Subjective:     Patient ID: Fernando Ruiz, male   DOB: 02/03/1931, 80 y.o.   MRN: [id number]  HPI patient presents with thick nails 1-5 both feet that are painful and he cannot cut   Review of Systems     Objective:   Physical Exam Neurovascular status intact with thick incurvated nailbeds that are painful 1-5 both feet    Assessment:     Mycotic nail infection with pain 1-5 both feet    Plan:     Debridement painful nail bed 1-5 both feet with no bleeding noted

## 2013-10-04 ENCOUNTER — Telehealth: Payer: Self-pay | Admitting: Neurology

## 2013-10-04 NOTE — Telephone Encounter (Signed)
I called wife and explained that you can see NP twice then see MD (per Medicare requirements).  (every 3rd visit).  They would see MD after this 5-15 visit).  She verbalized understanding.

## 2013-10-04 NOTE — Telephone Encounter (Signed)
Patient's wife calling to ask why patient's scheduled 10/29/13 appointment is with Darrol Angelarolyn Martin and not Dr. Vickey Hugerohmeier. Patient's wife thought that they are supposed to alternate with Dr. Vickey Hugerohmeier and Eber Jonesarolyn. Please call patient and advise.

## 2013-11-01 ENCOUNTER — Ambulatory Visit: Payer: Medicare Other | Admitting: Nurse Practitioner

## 2013-11-02 ENCOUNTER — Ambulatory Visit (INDEPENDENT_AMBULATORY_CARE_PROVIDER_SITE_OTHER): Payer: Medicare Other | Admitting: Nurse Practitioner

## 2013-11-02 ENCOUNTER — Encounter: Payer: Self-pay | Admitting: Nurse Practitioner

## 2013-11-02 ENCOUNTER — Encounter (INDEPENDENT_AMBULATORY_CARE_PROVIDER_SITE_OTHER): Payer: Self-pay

## 2013-11-02 VITALS — BP 131/69 | HR 65 | Ht 67.0 in | Wt 155.0 lb

## 2013-11-02 DIAGNOSIS — F0390 Unspecified dementia without behavioral disturbance: Secondary | ICD-10-CM

## 2013-11-02 DIAGNOSIS — G252 Other specified forms of tremor: Secondary | ICD-10-CM

## 2013-11-02 DIAGNOSIS — F319 Bipolar disorder, unspecified: Secondary | ICD-10-CM

## 2013-11-02 DIAGNOSIS — G219 Secondary parkinsonism, unspecified: Secondary | ICD-10-CM

## 2013-11-02 DIAGNOSIS — R259 Unspecified abnormal involuntary movements: Secondary | ICD-10-CM

## 2013-11-02 MED ORDER — DONEPEZIL HCL 10 MG PO TABS: 10.0000 mg | ORAL_TABLET | Freq: Every day | ORAL | Status: DC

## 2013-11-02 MED ORDER — TRIHEXYPHENIDYL HCL 2 MG PO TABS: 2.0000 mg | ORAL_TABLET | Freq: Every day | ORAL | Status: DC

## 2013-11-02 NOTE — Patient Instructions (Addendum)
Continue Aricept at current dose will refill Continue Artane at current dose will refill Followup yearly and when necessary Memory score is stable

## 2013-11-02 NOTE — Progress Notes (Signed)
[facility]  PATIENT: Fernando Ruiz DOB: 03/29/1931   REASON FOR VISIT: Followup for dementia and resting tremor    HISTORY OF PRESENT ILLNESS: Fernando Ruiz, 80 year old returns for followup. He was last seen 5/14/ 2014. He has had memory issues for about six years. The patient does not do the finances in the home. The patient does not drive. The patient is able to perform own ADL's. The patient is unable to distribute his own medications. The patients bladder and bowel are under good control. There have been no behavioral changes in the last year. There have been no hallucinations. His memory is currently stable. His Aricept dose was increased at his last visit . His  appetite and weight are stable. He sleeps well at night. He is seen by Fernando Ruiz nurse practitioner for bipolar disorder every 3 months . He returns for followup.    REVIEW OF SYSTEMS: Full 14 system review of systems performed and notable only for those listed, all others are neg:  Constitutional: N/A  Cardiovascular: N/A  Ear/Nose/Throat: N/A  Skin: N/A  Eyes: N/A  Respiratory: N/A  Gastroitestinal: Constipation  Hematology/Lymphatic: N/A  Endocrine: N/A Musculoskeletal:N/A  Allergy/Immunology: N/A  Neurological: Memory loss, tremors  Psychiatric: Depression anxiety  Sleep : Daytime sleepiness   ALLERGIES: Allergies  Allergen Reactions  . Penicillins     HOME MEDICATIONS: Outpatient Prescriptions Prior to Visit  Medication Sig Dispense Refill  . alendronate (FOSAMAX) 70 MG tablet 70 mg once a week.       Marland Kitchen. amLODipine (NORVASC) 2.5 MG tablet 2.5 mg daily.      . clonazePAM (KLONOPIN) 1 MG tablet 1 mg daily.      . divalproex (DEPAKOTE ER) 250 MG 24 hr tablet 250 mg daily.      Marland Kitchen. donepezil (ARICEPT) 10 MG tablet Take 1 tablet (10 mg total) by mouth daily.  90 tablet  3  . finasteride (PROSCAR) 5 MG tablet 5 mg daily.      Marland Kitchen. levothyroxine (SYNTHROID, LEVOTHROID) 50 MCG tablet 50  mcg daily.      Marland Kitchen. MYRBETRIQ 25 MG TB24 tablet       . omeprazole (PRILOSEC) 40 MG capsule 40 mg daily.      . risperiDONE (RISPERDAL) 1 MG tablet 1 mg daily.      . simvastatin (ZOCOR) 40 MG tablet 40 mg daily.      . trihexyphenidyl (ARTANE) 2 MG tablet Take 1 tablet (2 mg total) by mouth daily.  30 tablet  11  . traMADol (ULTRAM) 50 MG tablet        No facility-administered medications prior to visit.    PAST MEDICAL HISTORY: Past Medical History  Diagnosis Date  . Resting tremor   . Memory loss   . Unspecified cerebral artery occlusion with cerebral infarction   . Secondary parkinsonism   . High cholesterol   . Bipolar disorder   . Hypothyroidism   . Dementia     PAST SURGICAL HISTORY: Past Surgical History  Procedure Laterality Date  . None      FAMILY HISTORY: Family History  Problem Relation Age of Onset  . Stroke Father   . Cancer Mother   . Cancer Brother   . Heart disease Sister   . Diabetes Sister     SOCIAL HISTORY: History   Social History  . Marital Status: Married    Spouse Name: N/A    Number of Children: 1  . Years of Education: 12+  Occupational History  . Retired     Social History Main Topics  . Smoking status: Never Smoker   . Smokeless tobacco: Never Used  . Alcohol Use: No  . Drug Use: No  . Sexual Activity: Not on file   Other Topics Concern  . Not on file   Social History Narrative   Patient is married and lives at home with his wife Fernando Ruiz(Fernando Ruiz). Patient has one child and he is retired.     PHYSICAL EXAM  Filed Vitals:   11/02/13 1007  BP: 131/69  Pulse: 65  Height: 5\' 7"  (1.702 m)  Weight: 155 lb (70.308 kg)   Body mass index is 24.27 kg/(m^2). General: well developed, well nourished, seated, in no evident distress  Head: head normocephalic and atraumatic. Oropharynx benign  Neck: supple with no carotid or supraclavicular bruits  Cardiovascular: regular rate and rhythm, no murmurs  Neurologic Exam  Mental  Status: Awake and alert. MMSE 23/30, missing items in orientation calculation and copying a figure. He did miss 3/3 recall items. AFT 3. GDS=1  Cranial Nerves:  Pupils equal, briskly reactive to light. Extraocular movements full without nystagmus. Visual fields full to confrontation. Hearing intact and symmetric to finger snap. Facial sensation intact. Face, tongue, palate move normally and symmetrically. Mild facial dyskinesias noted .  Motor: Normal bulk and tone. Normal strength in all tested extremity muscles. No focal weakness. Mild resting tremor of the left hand, mild cogwheeling of the right wrist  Sensory.: intact to touch and pinprick and vibratory.  Coordination: Rapid alternating movements normal in all extremities.  Gait and Station: Arises from chair with the use of his arms . Gait demonstrates shortened stride length but no shuffling, loss of right arm swing. No fragmented turns .  Reflexes: 1+ and symmetric. Toes downgoing.   DIAGNOSTIC DATA (LABS, IMAGING, TESTING) - ASSESSMENT AND PLAN  80 y.o. year old male  has a past medical history of Resting tremor; Memory loss; Secondary parkinsonism; Bipolar disorder; and Dementia. here to followup.  Continue Aricept at current dose will refill Continue Artane at current dose will refill Followup yearly and when necessary Memory score is stable Fernando Ruiz, [facility] HospitalGNP, [facility] SurgeryBC, APRN  [facility] [address] [place], [place] [address] [phone]

## 2013-12-06 ENCOUNTER — Encounter: Payer: Self-pay | Admitting: Podiatry

## 2013-12-06 ENCOUNTER — Ambulatory Visit (INDEPENDENT_AMBULATORY_CARE_PROVIDER_SITE_OTHER): Payer: Medicare Other | Admitting: Podiatry

## 2013-12-06 VITALS — BP 152/90 | HR 78 | Resp 12

## 2013-12-06 DIAGNOSIS — B351 Tinea unguium: Secondary | ICD-10-CM

## 2013-12-06 DIAGNOSIS — M79673 Pain in unspecified foot: Secondary | ICD-10-CM

## 2013-12-06 DIAGNOSIS — M79609 Pain in unspecified limb: Secondary | ICD-10-CM

## 2013-12-06 NOTE — Progress Notes (Signed)
Subjective:     Patient ID: Fernando Ruiz, male   DOB: 03/05/1931, 80 y.o.   MRN: [id number]  HPI patient presents with thick deformed nailbeds 1-5 both feet that are painful and he cannot cut   Review of Systems     Objective:   Physical Exam Neurovascular status intact with thick yellow brittle nailbeds 1-5 of both feet    Assessment:     Mycotic nail infection with pain 1-5 both feet    Plan:     Debris painful nailbeds 1-5 both feet with no iatrogenic bleeding noted

## 2013-12-13 ENCOUNTER — Ambulatory Visit: Payer: Medicare Other | Admitting: Podiatry

## 2014-03-14 ENCOUNTER — Other Ambulatory Visit: Payer: Medicare Other

## 2014-03-18 ENCOUNTER — Ambulatory Visit (INDEPENDENT_AMBULATORY_CARE_PROVIDER_SITE_OTHER): Payer: Medicare Other | Admitting: Podiatry

## 2014-03-18 DIAGNOSIS — B351 Tinea unguium: Secondary | ICD-10-CM

## 2014-03-18 DIAGNOSIS — M79609 Pain in unspecified limb: Secondary | ICD-10-CM

## 2014-03-18 DIAGNOSIS — M79673 Pain in unspecified foot: Secondary | ICD-10-CM

## 2014-03-18 NOTE — Progress Notes (Signed)
   Subjective:    Patient ID: Fernando Ruiz, male    DOB: 07/01/1930, 80 y.o.   MRN: [id number]  HPI  Pt presents for nail debridement  Review of Systems     Objective:   Physical Exam        Assessment & Plan:

## 2014-03-20 NOTE — Progress Notes (Signed)
Subjective:     Patient ID: Fernando Ruiz, male   DOB: 05/03/1931, 80 y.o.   MRN: [id number]  HPI patient presents with nail disease 1-5 both feet that become painful and he cannot cut   Review of Systems     Objective:   Physical Exam Neurovascular status unchanged with thick yellow brittle nailbeds 1-5 both feet    Assessment:     Mycotic nail infection with pain 1-5 both feet    Plan:     Debridement painful nailbeds 1-5 both feet with no iatrogenic bleeding noted

## 2014-05-01 ENCOUNTER — Telehealth: Payer: Self-pay | Admitting: Neurology

## 2014-05-01 ENCOUNTER — Encounter: Payer: Self-pay | Admitting: Neurology

## 2014-05-01 NOTE — Telephone Encounter (Signed)
Printed and mailed letter with adjusted appointment time on 11/06/14 per Dr Dohmeier's schedule.  °

## 2014-05-08 ENCOUNTER — Encounter: Payer: Self-pay | Admitting: Neurology

## 2014-05-14 ENCOUNTER — Encounter: Payer: Self-pay | Admitting: Neurology

## 2014-05-18 ENCOUNTER — Emergency Department (HOSPITAL_COMMUNITY): Payer: Medicare Other

## 2014-05-18 ENCOUNTER — Inpatient Hospital Stay (HOSPITAL_COMMUNITY)
Admission: EM | Admit: 2014-05-18 | Discharge: 2014-05-28 | DRG: 393 | Disposition: A | Discharge status: Skilled Nursing Facility | Payer: Medicare Other | Attending: Internal Medicine | Admitting: Internal Medicine

## 2014-05-18 ENCOUNTER — Encounter (HOSPITAL_COMMUNITY): Payer: Self-pay | Admitting: *Deleted

## 2014-05-18 DIAGNOSIS — F0390 Unspecified dementia without behavioral disturbance: Secondary | ICD-10-CM | POA: Diagnosis present

## 2014-05-18 DIAGNOSIS — G20 Parkinson's disease: Secondary | ICD-10-CM | POA: Diagnosis present

## 2014-05-18 DIAGNOSIS — N170 Acute kidney failure with tubular necrosis: Secondary | ICD-10-CM | POA: Diagnosis present

## 2014-05-18 DIAGNOSIS — K2210 Ulcer of esophagus without bleeding: Secondary | ICD-10-CM | POA: Clinically undetermined

## 2014-05-18 DIAGNOSIS — R112 Nausea with vomiting, unspecified: Secondary | ICD-10-CM | POA: Diagnosis present

## 2014-05-18 DIAGNOSIS — Z87891 Personal history of nicotine dependence: Secondary | ICD-10-CM

## 2014-05-18 DIAGNOSIS — E860 Dehydration: Secondary | ICD-10-CM | POA: Diagnosis present

## 2014-05-18 DIAGNOSIS — F319 Bipolar disorder, unspecified: Secondary | ICD-10-CM | POA: Diagnosis present

## 2014-05-18 DIAGNOSIS — R1310 Dysphagia, unspecified: Secondary | ICD-10-CM | POA: Diagnosis present

## 2014-05-18 DIAGNOSIS — E870 Hyperosmolality and hypernatremia: Secondary | ICD-10-CM | POA: Insufficient documentation

## 2014-05-18 DIAGNOSIS — Z79899 Other long term (current) drug therapy: Secondary | ICD-10-CM

## 2014-05-18 DIAGNOSIS — K625 Hemorrhage of anus and rectum: Secondary | ICD-10-CM

## 2014-05-18 DIAGNOSIS — N251 Nephrogenic diabetes insipidus: Secondary | ICD-10-CM | POA: Diagnosis present

## 2014-05-18 DIAGNOSIS — X58XXXA Exposure to other specified factors, initial encounter: Secondary | ICD-10-CM | POA: Diagnosis present

## 2014-05-18 DIAGNOSIS — E7800 Pure hypercholesterolemia, unspecified: Secondary | ICD-10-CM | POA: Diagnosis present

## 2014-05-18 DIAGNOSIS — N12 Tubulo-interstitial nephritis, not specified as acute or chronic: Secondary | ICD-10-CM | POA: Diagnosis present

## 2014-05-18 DIAGNOSIS — K5900 Constipation, unspecified: Secondary | ICD-10-CM | POA: Diagnosis present

## 2014-05-18 DIAGNOSIS — E871 Hypo-osmolality and hyponatremia: Secondary | ICD-10-CM | POA: Diagnosis not present

## 2014-05-18 DIAGNOSIS — G219 Secondary parkinsonism, unspecified: Secondary | ICD-10-CM | POA: Diagnosis present

## 2014-05-18 DIAGNOSIS — I129 Hypertensive chronic kidney disease with stage 1 through stage 4 chronic kidney disease, or unspecified chronic kidney disease: Secondary | ICD-10-CM | POA: Diagnosis present

## 2014-05-18 DIAGNOSIS — K222 Esophageal obstruction: Secondary | ICD-10-CM | POA: Diagnosis present

## 2014-05-18 DIAGNOSIS — M810 Age-related osteoporosis without current pathological fracture: Secondary | ICD-10-CM | POA: Diagnosis present

## 2014-05-18 DIAGNOSIS — R109 Unspecified abdominal pain: Secondary | ICD-10-CM

## 2014-05-18 DIAGNOSIS — J189 Pneumonia, unspecified organism: Secondary | ICD-10-CM

## 2014-05-18 DIAGNOSIS — E876 Hypokalemia: Secondary | ICD-10-CM | POA: Diagnosis not present

## 2014-05-18 DIAGNOSIS — E039 Hypothyroidism, unspecified: Secondary | ICD-10-CM | POA: Diagnosis present

## 2014-05-18 DIAGNOSIS — K269 Duodenal ulcer, unspecified as acute or chronic, without hemorrhage or perforation: Secondary | ICD-10-CM | POA: Diagnosis present

## 2014-05-18 DIAGNOSIS — N183 Chronic kidney disease, stage 3 (moderate): Secondary | ICD-10-CM | POA: Diagnosis present

## 2014-05-18 DIAGNOSIS — Z66 Do not resuscitate: Secondary | ICD-10-CM | POA: Diagnosis present

## 2014-05-18 DIAGNOSIS — N390 Urinary tract infection, site not specified: Secondary | ICD-10-CM | POA: Clinically undetermined

## 2014-05-18 DIAGNOSIS — N179 Acute kidney failure, unspecified: Secondary | ICD-10-CM

## 2014-05-18 DIAGNOSIS — T18128A Food in esophagus causing other injury, initial encounter: Principal | ICD-10-CM | POA: Diagnosis present

## 2014-05-18 DIAGNOSIS — D72829 Elevated white blood cell count, unspecified: Secondary | ICD-10-CM | POA: Insufficient documentation

## 2014-05-18 DIAGNOSIS — Z8673 Personal history of transient ischemic attack (TIA), and cerebral infarction without residual deficits: Secondary | ICD-10-CM

## 2014-05-18 DIAGNOSIS — N400 Enlarged prostate without lower urinary tract symptoms: Secondary | ICD-10-CM | POA: Diagnosis present

## 2014-05-18 DIAGNOSIS — E872 Acidosis: Secondary | ICD-10-CM | POA: Diagnosis present

## 2014-05-18 DIAGNOSIS — Z880 Allergy status to penicillin: Secondary | ICD-10-CM

## 2014-05-18 DIAGNOSIS — K8020 Calculus of gallbladder without cholecystitis without obstruction: Secondary | ICD-10-CM | POA: Diagnosis present

## 2014-05-18 DIAGNOSIS — I6350 Cerebral infarction due to unspecified occlusion or stenosis of unspecified cerebral artery: Secondary | ICD-10-CM | POA: Diagnosis present

## 2014-05-18 LAB — COMPREHENSIVE METABOLIC PANEL
ALBUMIN: 4.4 g/dL (ref 3.5–5.2)
ALT: 19 U/L (ref 0–53)
AST: 35 U/L (ref 0–37)
Alkaline Phosphatase: 63 U/L (ref 39–117)
Anion gap: 21 — ABNORMAL HIGH (ref 5–15)
BILIRUBIN TOTAL: 0.6 mg/dL (ref 0.3–1.2)
BUN: 42 mg/dL — AB (ref 6–23)
CALCIUM: 10.5 mg/dL (ref 8.4–10.5)
CO2: 19 mEq/L (ref 19–32)
CREATININE: 3.31 mg/dL — AB (ref 0.50–1.35)
Chloride: 114 mEq/L — ABNORMAL HIGH (ref 96–112)
GFR calc Af Amer: 19 mL/min — ABNORMAL LOW (ref >90)
GFR calc non Af Amer: 16 mL/min — ABNORMAL LOW (ref >90)
Glucose, Bld: 130 mg/dL — ABNORMAL HIGH (ref 70–99)
Potassium: 4.2 mEq/L (ref 3.7–5.3)
Sodium: 154 mEq/L — ABNORMAL HIGH (ref 137–147)
Total Protein: 8.5 g/dL — ABNORMAL HIGH (ref 6.0–8.3)

## 2014-05-18 LAB — URINALYSIS, ROUTINE W REFLEX MICROSCOPIC
BILIRUBIN URINE: NEGATIVE
Glucose, UA: NEGATIVE mg/dL
KETONES UR: 15 mg/dL — AB
Leukocytes, UA: NEGATIVE
NITRITE: NEGATIVE
PH: 6 (ref 5.0–8.0)
Protein, ur: 30 mg/dL — AB
SPECIFIC GRAVITY, URINE: 1.011 (ref 1.005–1.030)
UROBILINOGEN UA: 0.2 mg/dL (ref 0.0–1.0)

## 2014-05-18 LAB — CBC WITH DIFFERENTIAL/PLATELET
BASOS ABS: 0 10*3/uL (ref 0.0–0.1)
BASOS PCT: 0 % (ref 0–1)
EOS ABS: 0 10*3/uL (ref 0.0–0.7)
EOS PCT: 0 % (ref 0–5)
HEMATOCRIT: 42.5 % (ref 39.0–52.0)
Hemoglobin: 13.9 g/dL (ref 13.0–17.0)
Lymphocytes Relative: 4 % — ABNORMAL LOW (ref 12–46)
Lymphs Abs: 0.6 10*3/uL — ABNORMAL LOW (ref 0.7–4.0)
MCH: 31.3 pg (ref 26.0–34.0)
MCHC: 32.7 g/dL (ref 30.0–36.0)
MCV: 95.7 fL (ref 78.0–100.0)
MONO ABS: 0.7 10*3/uL (ref 0.1–1.0)
MONOS PCT: 4 % (ref 3–12)
Neutro Abs: 14.6 10*3/uL — ABNORMAL HIGH (ref 1.7–7.7)
Neutrophils Relative %: 92 % — ABNORMAL HIGH (ref 43–77)
Platelets: 227 10*3/uL (ref 150–400)
RBC: 4.44 MIL/uL (ref 4.22–5.81)
RDW: 13.8 % (ref 11.5–15.5)
WBC: 15.9 10*3/uL — ABNORMAL HIGH (ref 4.0–10.5)

## 2014-05-18 LAB — URINE MICROSCOPIC-ADD ON

## 2014-05-18 LAB — PROTIME-INR
INR: 1.15 (ref 0.00–1.49)
Prothrombin Time: 14.8 seconds (ref 11.6–15.2)

## 2014-05-18 LAB — ABO/RH: ABO/RH(D): O POS

## 2014-05-18 LAB — TYPE AND SCREEN
ABO/RH(D): O POS
Antibody Screen: NEGATIVE

## 2014-05-18 LAB — TROPONIN I: Troponin I: <0.3 ng/mL (ref <0.30)

## 2014-05-18 LAB — POC OCCULT BLOOD, ED: FECAL OCCULT BLD: POSITIVE — AB

## 2014-05-18 LAB — LIPASE, BLOOD: LIPASE: 31 U/L (ref 11–59)

## 2014-05-18 LAB — APTT: aPTT: 30 seconds (ref 24–37)

## 2014-05-18 MED ORDER — DONEPEZIL HCL 10 MG PO TABS
10.0000 mg | ORAL_TABLET | Freq: Every day | ORAL | Status: DC
  Administered 2014-05-20 – 2014-05-28 (×9): 10 mg via ORAL
  Filled 2014-05-18 (×11): qty 1

## 2014-05-18 MED ORDER — DEXTROSE 5 % IV SOLN
Freq: Once | INTRAVENOUS | Status: AC
  Administered 2014-05-18: 16:00:00 via INTRAVENOUS

## 2014-05-18 MED ORDER — ONDANSETRON HCL 4 MG/2ML IJ SOLN
4.0000 mg | Freq: Four times a day (QID) | INTRAMUSCULAR | Status: DC | PRN
  Administered 2014-05-22: 4 mg via INTRAVENOUS
  Filled 2014-05-18 (×2): qty 2

## 2014-05-18 MED ORDER — HYDRALAZINE HCL 20 MG/ML IJ SOLN
5.0000 mg | Freq: Once | INTRAMUSCULAR | Status: AC
  Administered 2014-05-18: 5 mg via INTRAVENOUS
  Filled 2014-05-18: qty 1

## 2014-05-18 MED ORDER — LEVOTHYROXINE SODIUM 100 MCG IV SOLR
25.0000 ug | Freq: Every day | INTRAVENOUS | Status: DC
  Filled 2014-05-18 (×2): qty 5

## 2014-05-18 MED ORDER — DEXTROSE-NACL 5-0.2 % IV SOLN: INTRAVENOUS | Status: DC

## 2014-05-18 MED ORDER — HEPARIN SODIUM (PORCINE) 5000 UNIT/ML IJ SOLN
5000.0000 [IU] | Freq: Three times a day (TID) | INTRAMUSCULAR | Status: DC
  Administered 2014-05-18 – 2014-05-28 (×29): 5000 [IU] via SUBCUTANEOUS
  Filled 2014-05-18 (×38): qty 1

## 2014-05-18 MED ORDER — MIRABEGRON ER 25 MG PO TB24
25.0000 mg | ORAL_TABLET | Freq: Every day | ORAL | Status: DC
  Administered 2014-05-20: 25 mg via ORAL
  Filled 2014-05-18 (×3): qty 1

## 2014-05-18 MED ORDER — FLEET ENEMA 7-19 GM/118ML RE ENEM
1.0000 | ENEMA | Freq: Once | RECTAL | Status: AC | PRN
  Filled 2014-05-18: qty 1

## 2014-05-18 MED ORDER — RISPERIDONE 1 MG PO TABS
1.0000 mg | ORAL_TABLET | Freq: Every day | ORAL | Status: DC
  Administered 2014-05-20: 1 mg via ORAL
  Filled 2014-05-18 (×3): qty 1

## 2014-05-18 MED ORDER — ONDANSETRON HCL 4 MG/2ML IJ SOLN
4.0000 mg | Freq: Once | INTRAMUSCULAR | Status: AC
  Administered 2014-05-18: 4 mg via INTRAVENOUS
  Filled 2014-05-18: qty 2

## 2014-05-18 MED ORDER — DEXTROSE 5 % IV SOLN
INTRAVENOUS | Status: DC
  Administered 2014-05-18: 50 mL via INTRAVENOUS
  Administered 2014-05-20 (×2): via INTRAVENOUS
  Administered 2014-05-21: 1000 mL via INTRAVENOUS
  Administered 2014-05-21 – 2014-05-22 (×2): via INTRAVENOUS

## 2014-05-18 MED ORDER — IOHEXOL 300 MG/ML  SOLN: 25.0000 mL | INTRAMUSCULAR | Status: DC | PRN

## 2014-05-18 MED ORDER — FINASTERIDE 5 MG PO TABS
5.0000 mg | ORAL_TABLET | Freq: Every day | ORAL | Status: DC
  Administered 2014-05-20 – 2014-05-28 (×9): 5 mg via ORAL
  Filled 2014-05-18 (×11): qty 1

## 2014-05-18 MED ORDER — ONDANSETRON HCL 4 MG/2ML IJ SOLN
4.0000 mg | Freq: Four times a day (QID) | INTRAMUSCULAR | Status: DC
  Administered 2014-05-18 – 2014-05-28 (×37): 4 mg via INTRAVENOUS
  Filled 2014-05-18 (×36): qty 2

## 2014-05-18 MED ORDER — ONDANSETRON HCL 4 MG PO TABS: 4.0000 mg | ORAL_TABLET | Freq: Four times a day (QID) | ORAL | Status: DC | PRN

## 2014-05-18 MED ORDER — TRIHEXYPHENIDYL HCL 2 MG PO TABS
2.0000 mg | ORAL_TABLET | Freq: Every day | ORAL | Status: DC
  Administered 2014-05-20: 2 mg via ORAL
  Filled 2014-05-18 (×3): qty 1

## 2014-05-18 MED ORDER — SODIUM CHLORIDE 0.9 % IV SOLN: INTRAVENOUS | Status: DC

## 2014-05-18 MED ORDER — SODIUM CHLORIDE 0.9 % IV BOLUS (SEPSIS)
500.0000 mL | Freq: Once | INTRAVENOUS | Status: AC
  Administered 2014-05-18: 500 mL via INTRAVENOUS

## 2014-05-18 MED ORDER — DEXTROSE 5 % IV SOLN
INTRAVENOUS | Status: DC
  Administered 2014-05-18: 50 mL via INTRAVENOUS
  Administered 2014-05-19: 06:00:00 via INTRAVENOUS

## 2014-05-18 MED ORDER — LORAZEPAM 2 MG/ML IJ SOLN
0.5000 mg | INTRAMUSCULAR | Status: DC | PRN
  Administered 2014-05-19: 0.5 mg via INTRAVENOUS
  Filled 2014-05-18: qty 1

## 2014-05-18 NOTE — Progress Notes (Signed)
Phoned ED to get report, was transferred and no one answered.

## 2014-05-18 NOTE — Progress Notes (Signed)
Patient BP 185/99. Pt with tremors present baseline per family. Schorr, NP notified. Orders placed.

## 2014-05-18 NOTE — ED Provider Notes (Signed)
CSN: [id number]     Arrival date & time 05/18/14  1115 History   First MD Initiated Contact with Patient 05/18/14 1121     Chief Complaint  Patient presents with  . Nausea  . Constipation     (Consider location/radiation/quality/duration/timing/severity/associated sxs/prior Treatment) The history is provided by the patient and medical records.    This is a 80 year old male with past medical history significant for hypothyroidism, hyperlipidemia, bipolar disorder, dementia, prior CVA, presenting to the ED for constipation, nausea, and vomiting since Thursday. Family reports that patient has not had a bowel movement in nearly 3 days which is somewhat irregular for him-- they state he usually has a bowel movement either daily or every other day. States prior to this BM's were regular, no melena or hematochezia.  He does note some lower abdominal discomfort, but he states this is mild. He has been having intermittent nausea and vomiting. States occurs independent of eating. He has been unable to tolerate any PO solids or liquids since this time.  Denies fever, chills, sweats.  No chest pain or SOB.  Patient has had no prior abdominal surgeries.  No known sick contacts.  Patient not currently on anti-coagulants.  Wife reports patient did have colonoscopy and EGD 4 years ago by Dr[facility] states was normal as far as she remember  Past Medical History  Diagnosis Date  . Resting tremor   . Memory loss   . Unspecified cerebral artery occlusion with cerebral infarction   . Secondary parkinsonism   . High cholesterol   . Bipolar disorder   . Hypothyroidism   . Dementia    Past Surgical History  Procedure Laterality Date  . None     Family History  Problem Relation Age of Onset  . Stroke Father   . Cancer Mother   . Cancer Brother   . Heart disease Sister   . Diabetes Sister    History  Substance Use Topics  . Smoking status: Never Smoker   . Smokeless tobacco: Never Used  .  Alcohol Use: No    Review of Systems  Constitutional: Positive for appetite change.  Gastrointestinal: Positive for nausea, vomiting and abdominal pain.  All other systems reviewed and are negative.     Allergies  Penicillins  Home Medications   Prior to Admission medications   Medication Sig Start Date End Date Taking? Authorizing Provider  alendronate (FOSAMAX) 70 MG tablet 70 mg once a week.  10/23/12  Yes Historical Provider, MD  amLODipine (NORVASC) 5 MG tablet Take 5 mg by mouth daily. 04/07/14  Yes Historical Provider, MD  clonazePAM (KLONOPIN) 1 MG tablet Take 1 mg by mouth daily.  10/09/12  Yes Historical Provider, MD  divalproex (DEPAKOTE ER) 250 MG 24 hr tablet Take 250 mg by mouth daily.  10/19/12  Yes Historical Provider, MD  donepezil (ARICEPT) 10 MG tablet Take 1 tablet (10 mg total) by mouth daily. 11/02/13  Yes Nilda RiggsNancy Carolyn Martin, NP  finasteride (PROSCAR) 5 MG tablet Take 5 mg by mouth daily.  10/03/12  Yes Historical Provider, MD  levothyroxine (SYNTHROID, LEVOTHROID) 50 MCG tablet Take 50 mcg by mouth daily.  10/18/12  Yes Historical Provider, MD  MYRBETRIQ 25 MG TB24 tablet Take 25 mg by mouth daily.  07/23/13  Yes Historical Provider, MD  omeprazole (PRILOSEC) 40 MG capsule Take 40 mg by mouth daily.  10/26/12  Yes Historical Provider, MD  risperiDONE (RISPERDAL) 1 MG tablet Take 1 mg by mouth daily.  07/25/12  Yes Historical Provider, MD  simvastatin (ZOCOR) 40 MG tablet Take 40 mg by mouth daily.  10/18/12  Yes Historical Provider, MD  trihexyphenidyl (ARTANE) 2 MG tablet Take 1 tablet (2 mg total) by mouth daily. 11/02/13  Yes Nilda RiggsNancy Carolyn Martin, NP   BP 152/97 mmHg  Pulse 107  Temp(Src) 97.8 F (36.6 C) (Oral)  Resp 20  Ht 5\' 8"  (1.727 m)  Wt 157 lb (71.215 kg)  BMI 23.88 kg/m2  SpO2 100%   Physical Exam  Constitutional: He is oriented to person, place, and time. He appears well-developed and well-nourished.  HENT:  Head: Normocephalic and atraumatic.   Mouth/Throat: Oropharynx is clear and moist.  Dry mucous membranes  Eyes: Conjunctivae and EOM are normal. Pupils are equal, round, and reactive to light.  Neck: Normal range of motion.  Cardiovascular: Normal rate, regular rhythm and normal heart sounds.   Pulmonary/Chest: Effort normal and breath sounds normal. No respiratory distress. He has no wheezes.  Abdominal: Soft. Bowel sounds are normal. There is tenderness in the right lower quadrant and left lower quadrant. There is no guarding.  Abdomen soft, non-distended; mild tenderness of bilateral lower quadrants without rebound or guarding; no peritoneal signs  Genitourinary: Rectal exam shows no external hemorrhoid, no internal hemorrhoid, no fissure, no mass, no tenderness and anal tone normal. Guaiac positive stool.  No external hemorrhoids; normal rectal tone; watery pink stool noted on glove; guaiac +; no fecal impaction or masses noted  Musculoskeletal: Normal range of motion.  Neurological: He is alert and oriented to person, place, and time.  Skin: Skin is warm and dry.  Psychiatric: He has a normal mood and affect.  Nursing note and vitals reviewed.   ED Course  Procedures (including critical care time) Labs Review Labs Reviewed  CBC WITH DIFFERENTIAL - Abnormal; Notable for the following:    WBC 15.9 (*)    Neutrophils Relative % 92 (*)    Neutro Abs 14.6 (*)    Lymphocytes Relative 4 (*)    Lymphs Abs 0.6 (*)    All other components within normal limits  COMPREHENSIVE METABOLIC PANEL - Abnormal; Notable for the following:    Sodium 154 (*)    Chloride 114 (*)    Glucose, Bld 130 (*)    BUN 42 (*)    Creatinine, Ser 3.31 (*)    Total Protein 8.5 (*)    GFR calc non Af Amer 16 (*)    GFR calc Af Amer 19 (*)    Anion gap 21 (*)    All other components within normal limits  POC OCCULT BLOOD, ED - Abnormal; Notable for the following:    Fecal Occult Bld POSITIVE (*)    All other components within normal limits   LIPASE, BLOOD  PROTIME-INR  APTT  TROPONIN I  URINALYSIS, ROUTINE W REFLEX MICROSCOPIC  TYPE AND SCREEN  ABO/RH    Imaging Review Ct Abdomen Pelvis Wo Contrast  05/18/2014   CLINICAL DATA:  Constipation for the past 3 days. Nausea and vomiting for the past 2 days. Worsening abdominal pain for the past 2 days.  EXAM: CT ABDOMEN AND PELVIS WITHOUT CONTRAST  TECHNIQUE: Multidetector CT imaging of the abdomen and pelvis was performed following the standard protocol without IV contrast.  COMPARISON:  None.  FINDINGS: Small sliding hiatal hernia. Calcified granulomata in the liver. Unremarkable non contrasted appearance of the spleen, pancreas, adrenal glands, urinary bladder and prostate gland.  1.2 cm gallstone in the gallbladder.  No gallbladder wall thickening or pericholecystic fluid. 1.2 cm mid left renal cyst or calyceal diverticulum with dependent calcific density. 6 mm hemorrhagic lower pole left renal cyst. Both kidneys are somewhat small.  Scattered colonic diverticula without evidence of diverticulitis. No enlarged lymph nodes. Atheromatous arterial calcifications. Normal amount of stool in the colon. Mild bilateral lower lobe cylindrical bronchiectasis. Lumbar and lower thoracic spine degenerative changes and mild scoliosis.  IMPRESSION: 1. No acute abnormality. 2. Small sliding hiatal hernia. 3. Cholelithiasis. 4. Mild bilateral lower lobe cylindrical bronchiectasis.   Electronically Signed   By: Gordan Payment M.D.   On: 05/18/2014 14:08     EKG Interpretation None      MDM   Final diagnoses:  Abdominal pain  Nausea and vomiting  Rectal bleeding  AKI (acute kidney injury)   80 y.o. M with constipation, N/V x 3 days.  Unable to tolerate PO at home.  On arrival, patient mildly tachycardia but afebrile and non-toxic in appearance.  Abdominal exam with mild tenderness of bilateral lower quadrants.  Rectal exam performed to evaluate for fecal impaction, pink watery stool noted.  Guaiac  +, patient not currently on anti-coagulants.  Serum creatinine today 3.31, no prior available for comparison. Local blood positive, H&H stable. CT abdomen and pelvis negative for acute findings-- noted cholelithiasis without findings of cholecystitis as well as diverticulosis without diverticulitis. Case discussed with Dr. Evlyn Kanner from [facility], patient's last serum creatinine at office visit on 10/29 was 2.1.  Findings today consistent with AKI, likely secondary to dehydration from nausea vomiting. Patient given fluids in the ED, will need admission.  Case discussed with hospitalist, Dr. Mahala Menghini-- has evaluated patient in the ED and will admit.  Notified GI, Dr. Madilyn Fireman, or patient's admission-- he will see patient in consult.  Garlon Hatchet, PA-C 05/18/14 1608  Gilda Crease, MD 05/18/14 [phone]

## 2014-05-18 NOTE — ED Notes (Signed)
Pt in with family c/o constipation since Wednesday, n/v since Thursday, family reports patient can't keep any food or fluids down

## 2014-05-18 NOTE — ED Provider Notes (Signed)
Patient presented to the ER with nausea, vomiting, constipation. Patient has not had a bowel movement in 3 days. He has been vomiting after eating, cannot hold anything down. Patient denies fever. He has not had any significant abdominal pain.  Face to face Exam: HEENT - PERRLA Lungs - CTAB Heart - RRR, no M/R/G Abd - S/NT/ND, bowel sounds decreased Neuro - alert, oriented x3  Plan: Workup including CT scan to rule out obstruction, inflammation, infection.   Gilda Creasehristopher J. Porsche Noguchi, MD 05/18/14 1228

## 2014-05-18 NOTE — ED Notes (Signed)
Pt from home for eval of abdominal pain, nausea, and vomiting. Pt denies any hemoptysis, wife reports that pt unable to keep any food or fluids down. Per family, pt has been having flem in his emesis. nad noted. Pt alert, hx of dementia.

## 2014-05-18 NOTE — Plan of Care (Signed)
Problem: Phase I Progression Outcomes Goal: Pain controlled with appropriate interventions Outcome: Not Applicable Date Met:  48/38/93

## 2014-05-18 NOTE — ED Notes (Signed)
Pt unable to keep CT fluids down, PA [place] notified.

## 2014-05-18 NOTE — H&P (Signed)
[facility] History and Physical  Donnal MoatJack W Gentzler ZOX:[id number] DOB: 06/27/1930 DOA: 05/18/2014  Referring physician: ED PCP: Hoyle SauerAVVA,[name], MD  Specialists: None  Chief Complaint: AKI, N/V  HPI: 80 y/o known h/o Parkisnon's disease, apparent dementia with recent MMSe 23/30 by Neurology 10/2013 [stable], CKD basleine creatinine 2.1, Prior Esophageal stricture s/p dilatation 03/2001, psychiatric admission for BIpolar [facility]. He was in his usual state of affairs until 3 days ago when he was not able to take any food or any liquids. He does not complain of any specific difficulty swallowing or pain on swallowing vomits as soon as he eats. He has not had any diarrhea he has been constipated for 3 or 4 days. He has no body aches no fever has not eaten any outside food has had no ill contacts denies any exotic travel. He has had some stomach pain however according to his wife and his son. Patient denies overt chest pain or shortness of breath does not have any dysuria passes urine regularly-but small amounts He is a little bit confused at the bedside and cannot tell me any specific localization in terms of where he is, city that his N, year. He's had no recent changes of his medications other than recent increase slightly on his Aricept. It is noted that he has had a colonoscopy with removal of polyps about 4 years ago and an endoscopy with dilation of the EGD at the last   emergency room workup revealed ketones in the urine with mild protein urine hyaline casts CT abdomen and pelvis showed No acute abnormality small sliding hiatal hernia and nonobstructive cholelithiasis   sodium 154 chloride 114 BUN 42 creatinine  3.31, LFTs normal, 0.2 troponin negative next line  WBC 15.9 hemoglobin 13.9   Point-of-care troponin 0.3  EKG showed PR interval 0.0 QRS axis 60, PVC noted, no ST-T wave abnormalities across precordium    The patient to work feels completely went to toes grade in high  school quit smoking 2004 never drinker.  Review of Systems: The patient denies  Any other focal findings and does deny  Past Medical History  Diagnosis Date  . Resting tremor   . Memory loss   . Unspecified cerebral artery occlusion with cerebral infarction   . Secondary parkinsonism   . High cholesterol   . Bipolar disorder   . Hypothyroidism   . Dementia    Past Surgical History  Procedure Laterality Date  . None     Social History:  History   Social History Narrative   Patient is married and lives at home with [name]. Patient has one child and he is retired.    Allergies  Allergen Reactions  . Penicillins Rash    Family History  Problem Relation Age of Onset  . Stroke Father   . Cancer Mother   . Cancer Brother   . Heart disease Sister   . Diabetes Sister     Prior to Admission medications   Medication Sig Start Date End Date Taking? Authorizing Provider  alendronate (FOSAMAX) 70 MG tablet 70 mg once a week.  10/23/12  Yes Historical Provider, MD  amLODipine (NORVASC) 5 MG tablet Take 5 mg by mouth daily. 04/07/14  Yes Historical Provider, MD  clonazePAM (KLONOPIN) 1 MG tablet Take 1 mg by mouth daily.  10/09/12  Yes Historical Provider, MD  divalproex (DEPAKOTE ER) 250 MG 24 hr tablet Take 250 mg by mouth daily.  10/19/12  Yes Historical  Provider, MD  donepezil (ARICEPT) 10 MG tablet Take 1 tablet (10 mg total) by mouth daily. 11/02/13  Yes Nilda Riggs, NP  finasteride (PROSCAR) 5 MG tablet Take 5 mg by mouth daily.  10/03/12  Yes Historical Provider, MD  levothyroxine (SYNTHROID, LEVOTHROID) 50 MCG tablet Take 50 mcg by mouth daily.  10/18/12  Yes Historical Provider, MD  MYRBETRIQ 25 MG TB24 tablet Take 25 mg by mouth daily.  07/23/13  Yes Historical Provider, MD  omeprazole (PRILOSEC) 40 MG capsule Take 40 mg by mouth daily.  10/26/12  Yes Historical Provider, MD  risperiDONE (RISPERDAL) 1 MG tablet Take 1 mg by mouth daily.  07/25/12  Yes Historical  Provider, MD  simvastatin (ZOCOR) 40 MG tablet Take 40 mg by mouth daily.  10/18/12  Yes Historical Provider, MD  trihexyphenidyl (ARTANE) 2 MG tablet Take 1 tablet (2 mg total) by mouth daily. 11/02/13  Yes Nilda Riggs, NP   Physical Exam: Filed Vitals:   05/18/14 1215 05/18/14 1230 05/18/14 1245 05/18/14 1300  BP: 166/110 176/105 178/104 177/106  Pulse: 95 84 97 111  Temp:      TempSrc:      Resp: 23 18 21 19   Height:      Weight:      SpO2: 89% 94% 90% 86%     General:   Alert pleasant but frail , edentulous, no thrush  Eyes:  EOMI Arcus senilis  ENT:    no JVD or bruit  Neck:  Is soft supple  Cardiovascular:  S1-S2 slightly tachycardic  Respiratory:  Clinically clear no TVR no TVF.  Abdomen:  Soft nontender nondistended slight epigastric discomfort on deep palpation  Skin:  No lower extremity edema  Musculoskeletal:  Range of motion intact  Psychiatric:  Confused euthymic  Neurologic:  Power 5/5 bilaterally in both upper and lower extremities however fine tremor intention is noted. Smile is symmetrical.  Labs on Admission:  Basic Metabolic Panel:  Recent Labs Lab 05/18/14 1202  NA 154*  K 4.2  CL 114*  CO2 19  GLUCOSE 130*  BUN 42*  CREATININE 3.31*  CALCIUM 10.5   Liver Function Tests:  Recent Labs Lab 05/18/14 1202  AST 35  ALT 19  ALKPHOS 63  BILITOT 0.6  PROT 8.5*  ALBUMIN 4.4    Recent Labs Lab 05/18/14 1202  LIPASE 31   No results for input(s): AMMONIA in the last 168 hours. CBC:  Recent Labs Lab 05/18/14 1202  WBC 15.9*  NEUTROABS 14.6*  HGB 13.9  HCT 42.5  MCV 95.7  PLT 227   Cardiac Enzymes:  Recent Labs Lab 05/18/14 1202  TROPONINI <0.30    BNP (last 3 results) No results for input(s): PROBNP in the last 8760 hours. CBG: No results for input(s): GLUCAP in the last 168 hours.  Radiological Exams on Admission: Ct Abdomen Pelvis Wo Contrast  05/18/2014   CLINICAL DATA:  Constipation for the past 3  days. Nausea and vomiting for the past 2 days. Worsening abdominal pain for the past 2 days.  EXAM: CT ABDOMEN AND PELVIS WITHOUT CONTRAST  TECHNIQUE: Multidetector CT imaging of the abdomen and pelvis was performed following the standard protocol without IV contrast.  COMPARISON:  None.  FINDINGS: Small sliding hiatal hernia. Calcified granulomata in the liver. Unremarkable non contrasted appearance of the spleen, pancreas, adrenal glands, urinary bladder and prostate gland.  1.2 cm gallstone in the gallbladder. No gallbladder wall thickening or pericholecystic fluid. 1.2 cm mid left  renal cyst or calyceal diverticulum with dependent calcific density. 6 mm hemorrhagic lower pole left renal cyst. Both kidneys are somewhat small.  Scattered colonic diverticula without evidence of diverticulitis. No enlarged lymph nodes. Atheromatous arterial calcifications. Normal amount of stool in the colon. Mild bilateral lower lobe cylindrical bronchiectasis. Lumbar and lower thoracic spine degenerative changes and mild scoliosis.  IMPRESSION: 1. No acute abnormality. 2. Small sliding hiatal hernia. 3. Cholelithiasis. 4. Mild bilateral lower lobe cylindrical bronchiectasis.   Electronically Signed   By: Gordan PaymentSteve  Reid M.D.   On: 05/18/2014 14:08    EKG: Independently reviewed.  See above  Assessment/Plan Principal Problem:   AKI (acute kidney injury)- patient has likely ATN secondary to intractable nausea vomiting. Start Zofran, allow sips and chips, clear diet , consult GI as this probably  Represents a stricture that has recurred Active Problems:    Hypernatremia-  Patient has 3.2 L water deficit which we will correct with D5W.    This is supposed to be corrected over 48-72 hours.   It should correct according to my calculation with 50 cc per hour over 72 hours to 140.  Rpt labs in am   Cerebral artery occlusion with cerebral infarction   Secondary parkinsonism- The the patient is not in any overt treatment for   Parkinson's other than Mirabegron 25 mg daily and trihexyphenidyl 2 mg, Continue Aricept 10 mg daily - there is some overlay between dementia and his Parkinson's. He may benefit from closer neurological input and initiation of low-dose levodopa. We will continue medications orally if he is able to tolerate otherwise they will have to be held.   High cholesterol- hold statin for now   Bipolar disorder-     Hypothyroidism-dose his thyroxine iv 25mcg daily until reliable po intake can be assured   Dementia-see above discussion   Uncontrollable nausea and vomiting- see above discussion-[facility] will be consulted as per my request from the EDP.  Would hold Fosamax as this can cause worsening of GERD   Constipation-Will give a fleet enema.  Monitor progress   Pyelonephritis- get in and out catheterization as clean catch urine analysis unreliable.   Follow cultures. We will dose with one dose of Rocephin and if there is suggestion of infection we'll continue treatment as patient has leukocytosis.   Leukocytosis also could be reactive secondary to vomiting   DO NOT RESUSCITATE  Discussed with wife and son at outside   Admit to MedSurg  Time spent: 45 min  Mahala MenghiniSAMTANI, [facility] ManhattanJAI-GURMUKH [facility] Pager [phone]  If 7PM-7AM, please contact night-coverage [email or web address] Password [email or web address] 05/18/2014, 3:19 PM

## 2014-05-19 ENCOUNTER — Inpatient Hospital Stay (HOSPITAL_COMMUNITY): Payer: Medicare Other

## 2014-05-19 LAB — COMPREHENSIVE METABOLIC PANEL
ALT: 28 U/L (ref 0–53)
ANION GAP: 19 — AB (ref 5–15)
AST: 66 U/L — AB (ref 0–37)
Albumin: 4.1 g/dL (ref 3.5–5.2)
Alkaline Phosphatase: 64 U/L (ref 39–117)
BILIRUBIN TOTAL: 0.8 mg/dL (ref 0.3–1.2)
BUN: 45 mg/dL — ABNORMAL HIGH (ref 6–23)
CHLORIDE: 117 meq/L — AB (ref 96–112)
CO2: 19 meq/L (ref 19–32)
Calcium: 10 mg/dL (ref 8.4–10.5)
Creatinine, Ser: 3.18 mg/dL — ABNORMAL HIGH (ref 0.50–1.35)
GFR calc Af Amer: 20 mL/min — ABNORMAL LOW (ref >90)
GFR, EST NON AFRICAN AMERICAN: 17 mL/min — AB (ref >90)
GLUCOSE: 106 mg/dL — AB (ref 70–99)
Potassium: 4.4 mEq/L (ref 3.7–5.3)
Sodium: 155 mEq/L — ABNORMAL HIGH (ref 137–147)
Total Protein: 8.1 g/dL (ref 6.0–8.3)

## 2014-05-19 LAB — BASIC METABOLIC PANEL
Anion gap: 19 — ABNORMAL HIGH (ref 5–15)
BUN: 45 mg/dL — ABNORMAL HIGH (ref 6–23)
CALCIUM: 10.2 mg/dL (ref 8.4–10.5)
CO2: 19 mEq/L (ref 19–32)
Chloride: 118 mEq/L — ABNORMAL HIGH (ref 96–112)
Creatinine, Ser: 3.16 mg/dL — ABNORMAL HIGH (ref 0.50–1.35)
GFR calc Af Amer: 20 mL/min — ABNORMAL LOW (ref >90)
GFR, EST NON AFRICAN AMERICAN: 17 mL/min — AB (ref >90)
GLUCOSE: 127 mg/dL — AB (ref 70–99)
Potassium: 4 mEq/L (ref 3.7–5.3)
Sodium: 156 mEq/L — ABNORMAL HIGH (ref 137–147)

## 2014-05-19 LAB — CBC
HCT: 42.2 % (ref 39.0–52.0)
Hemoglobin: 13.3 g/dL (ref 13.0–17.0)
MCH: 29.9 pg (ref 26.0–34.0)
MCHC: 31.5 g/dL (ref 30.0–36.0)
MCV: 94.8 fL (ref 78.0–100.0)
PLATELETS: 244 10*3/uL (ref 150–400)
RBC: 4.45 MIL/uL (ref 4.22–5.81)
RDW: 14 % (ref 11.5–15.5)
WBC: 18.6 10*3/uL — ABNORMAL HIGH (ref 4.0–10.5)

## 2014-05-19 LAB — PROTIME-INR
INR: 1.14 (ref 0.00–1.49)
Prothrombin Time: 14.7 seconds (ref 11.6–15.2)

## 2014-05-19 MED ORDER — LEVOTHYROXINE SODIUM 50 MCG PO TABS
50.0000 ug | ORAL_TABLET | Freq: Every day | ORAL | Status: DC
  Administered 2014-05-20 – 2014-05-28 (×8): 50 ug via ORAL
  Filled 2014-05-19 (×12): qty 1

## 2014-05-19 MED ORDER — CLONAZEPAM 1 MG PO TABS
1.0000 mg | ORAL_TABLET | Freq: Every day | ORAL | Status: DC
  Administered 2014-05-20: 1 mg via ORAL
  Filled 2014-05-19: qty 1

## 2014-05-19 MED ORDER — AMLODIPINE BESYLATE 5 MG PO TABS
5.0000 mg | ORAL_TABLET | Freq: Every day | ORAL | Status: DC
  Administered 2014-05-20: 5 mg via ORAL
  Filled 2014-05-19 (×2): qty 1

## 2014-05-19 MED ORDER — DIVALPROEX SODIUM ER 250 MG PO TB24
250.0000 mg | ORAL_TABLET | Freq: Every day | ORAL | Status: DC
  Administered 2014-05-20 – 2014-05-28 (×9): 250 mg via ORAL
  Filled 2014-05-19 (×10): qty 1

## 2014-05-19 MED ORDER — PANTOPRAZOLE SODIUM 40 MG PO TBEC
40.0000 mg | DELAYED_RELEASE_TABLET | Freq: Every day | ORAL | Status: DC
  Administered 2014-05-20 – 2014-05-28 (×9): 40 mg via ORAL
  Filled 2014-05-19 (×9): qty 1

## 2014-05-19 MED ORDER — LABETALOL HCL 5 MG/ML IV SOLN
10.0000 mg | Freq: Once | INTRAVENOUS | Status: AC
  Administered 2014-05-19: 10 mg via INTRAVENOUS
  Filled 2014-05-19 (×2): qty 4

## 2014-05-19 NOTE — Progress Notes (Signed)
Fernando Ruiz UJW:[id number] DOB: 06/18/1931 DOA: 05/18/2014 PCP: Fernando Ruiz  Brief narrative:  80 year old male with history of secondary parkinsonism, potential dementia, see daily stage II, primary survey showed cavitation about 4 years ago, psychiatric disorders, osteoporosis, hypothyroidism, prior CVA admitted with intractable nausea vomiting 11/29  Past medical history-As per Problem list Chart reviewed as below- nad  Consultants:   Dr. Madilyn Ruiz [facility]  Procedures:  None yet  Antibiotics:  none   Subjective  Alert pleasant oriented.  Scared to [place] or drink but then ate Jello without issue.  No cp No Vomit Hasn't passed stool   Objective    Interim History:   Telemetry:    Objective: Filed Vitals:   05/18/14 2216 05/18/14 2316 05/19/14 0022 05/19/14 0538  BP: 185/99 178/107 169/110 172/114  Pulse: 90 92 95 101  Temp: 98.4 F (36.9 C)   100 F (37.8 C)  TempSrc: Oral   Oral  Resp: 18  18 18   Height:      Weight:      SpO2: 97%  94% 94%    Intake/Output Summary (Last 24 hours) at 05/19/14 1104 Last data filed at 05/19/14 0535  Gross per 24 hour  Intake    975 ml  Output    300 ml  Net    675 ml    Exam:  General: eomi, ncat Cardiovascular: s1 s 2no m/r/g Respiratory: clear no added sound Abdomen: soft nt  Data Reviewed: Basic Metabolic Panel:  Recent Labs Lab 05/18/14 1202 05/19/14 0420 05/19/14 0903  NA 154* 155* 156*  K 4.2 4.4 4.0  CL 114* 117* 118*  CO2 19 19 19   GLUCOSE 130* 106* 127*  BUN 42* 45* 45*  CREATININE 3.31* 3.18* 3.16*  CALCIUM 10.5 10.0 10.2   Liver Function Tests:  Recent Labs Lab 05/18/14 1202 05/19/14 0420  AST 35 66*  ALT 19 28  ALKPHOS 63 64  BILITOT 0.6 0.8  PROT 8.5* 8.1  ALBUMIN 4.4 4.1    Recent Labs Lab 05/18/14 1202  LIPASE 31   No results for input(s): AMMONIA in the last 168 hours. CBC:  Recent Labs Lab 05/18/14 1202 05/19/14 0420  WBC 15.9* 18.6*  NEUTROABS 14.6*   --   HGB 13.9 13.3  HCT 42.5 42.2  MCV 95.7 94.8  PLT 227 244   Cardiac Enzymes:  Recent Labs Lab 05/18/14 1202  TROPONINI <0.30   BNP: Invalid input(s): POCBNP CBG: No results for input(s): GLUCAP in the last 168 hours.  No results found for this or any previous visit (from the past 240 hour(s)).   Studies:              All Imaging reviewed and is as per above notation   Scheduled Meds: . donepezil  10 mg Oral Daily  . finasteride  5 mg Oral Daily  . heparin  5,000 Units Subcutaneous 3 times per day  . labetalol  10 mg Intravenous Once  . levothyroxine  25 mcg Intravenous QAC breakfast  . mirabegron ER  25 mg Oral Daily  . ondansetron (ZOFRAN) IV  4 mg Intravenous 4 times per day  . risperiDONE  1 mg Oral Daily  . trihexyphenidyl  2 mg Oral Daily   Continuous Infusions: . dextrose 50 mL/hr at 05/19/14 0530  . dextrose 50 mL (05/18/14 1747)     Assessment/Plan:  1. N/v-unclear etiology-potentially globus vs stricture.  Appreciate GI input-continue clear liquid diet.  Might also be viral illness  such as Norovirus.  If able to tolerate clears, graduate diet in am? 2. Parkinsons-continue Trihexphenidyl 2 mg daily, Aricept 10 daily, Mirabegron ER 25 daily.  Consider addition Carbidopa as OP. 3. Hypernatremia-still elevated sodium.  Rpt labs confirm this. Continue D5 water 50 cc/hour  4. Leukocytosis-unclear etiology-Will obtain CXR rule out aspiration given h/o n/v.  Hold antibiotics for now but low grade temp noted 11/29 am  5. Elevated AST-unclear etiology-rpet labs in am 6. H/o CVA-stable 7. Bipolar- Riperidon2e 1 mg daily re-ordered as well at clonazepam 1 mg daily 8. BPH-continue Finsteride 5 mg daily 9. Htn-restarted amlodipine 5 daily.  Monitor trends.  Augment Rx if needed   Code Status: Full Family Communication:  Nonne bedside Disposition Plan:  Inpatient pending resolution   Pleas KochJai Shanan Fitzpatrick, Ruiz  [facility] Pager [phone] 05/19/2014, 11:04  AM    LOS: 1 day

## 2014-05-19 NOTE — Progress Notes (Signed)
Patient 's BP 172/114 this morning. HR 94. Patient present with tremors. Schorr, NP notified.

## 2014-05-19 NOTE — Plan of Care (Signed)
Problem: Phase I Progression Outcomes Goal: OOB as tolerated unless otherwise ordered Outcome: Progressing Patient is a 2 assist to stand at bedside.

## 2014-05-19 NOTE — Progress Notes (Signed)
Pt alert to self, no c/o pain or N/V, pt oob assist x 2, pt resting comfortably, pt stable

## 2014-05-19 NOTE — Plan of Care (Signed)
Problem: Phase I Progression Outcomes Goal: OOB as tolerated unless otherwise ordered Outcome: Not Progressing Patient unsteady, needs 2 assist.

## 2014-05-19 NOTE — Consult Note (Signed)
[facility] Consult Note  Referring Provider: No ref. provider found Primary Care Physician:  Tivis Ringer, MD Primary Gastroenterologist:  Dr.  Laurel Dimmer Complaint: vomiting HPI: Fernando Ruiz is an 80 y.o. white  male  Who presents with a 3-4 day history of vomiting. I cannot be sure that he is not describing dysphagia or food impaction but it sounds more like vomiting. He does have a history of esophageal stricture was dilated several years ago. He is not spitting up his saliva and he is not in any discomfort. He tolerated some clear liquids this morning. CT scan of the abdomen was relatively unrevealing. He is not having any diarrhea  Past Medical History  Diagnosis Date  . Resting tremor   . Memory loss   . Unspecified cerebral artery occlusion with cerebral infarction   . Secondary parkinsonism   . High cholesterol   . Bipolar disorder   . Hypothyroidism   . Dementia     Past Surgical History  Procedure Laterality Date  . None      Medications Prior to Admission  Medication Sig Dispense Refill  . alendronate (FOSAMAX) 70 MG tablet 70 mg once a week.     Marland Kitchen amLODipine (NORVASC) 5 MG tablet Take 5 mg by mouth daily.    . clonazePAM (KLONOPIN) 1 MG tablet Take 1 mg by mouth daily.     . divalproex (DEPAKOTE ER) 250 MG 24 hr tablet Take 250 mg by mouth daily.     Marland Kitchen donepezil (ARICEPT) 10 MG tablet Take 1 tablet (10 mg total) by mouth daily. 90 tablet 3  . finasteride (PROSCAR) 5 MG tablet Take 5 mg by mouth daily.     Marland Kitchen levothyroxine (SYNTHROID, LEVOTHROID) 50 MCG tablet Take 50 mcg by mouth daily.     Marland Kitchen MYRBETRIQ 25 MG TB24 tablet Take 25 mg by mouth daily.     Marland Kitchen omeprazole (PRILOSEC) 40 MG capsule Take 40 mg by mouth daily.     . risperiDONE (RISPERDAL) 1 MG tablet Take 1 mg by mouth daily.     . simvastatin (ZOCOR) 40 MG tablet Take 40 mg by mouth daily.     . trihexyphenidyl (ARTANE) 2 MG tablet Take 1 tablet (2 mg total) by mouth daily. 30 tablet 11     Allergies:  Allergies  Allergen Reactions  . Penicillins Rash    Family History  Problem Relation Age of Onset  . Stroke Father   . Cancer Mother   . Cancer Brother   . Heart disease Sister   . Diabetes Sister     Social History:  reports that he has never smoked. He has never used smokeless tobacco. He reports that he does not drink alcohol or use illicit drugs.  Review of Systems: negative except as above   Blood pressure 172/114, pulse 101, temperature 100 F (37.8 C), temperature source Oral, resp. rate 18, height '5\' 8"'  (1.727 m), weight 71.215 kg (157 lb), SpO2 94 %. Head: Normocephalic, without obvious abnormality, atraumatic Neck: no adenopathy, no carotid bruit, no JVD, supple, symmetrical, trachea midline and thyroid not enlarged, symmetric, no tenderness/mass/nodules Resp: clear to auscultation bilaterally Cardio: regular rate and rhythm, S1, S2 normal, no murmur, click, rub or gallop GI: abdomen soft nondistended with normoactive bowel sounds. No spinal mainly mass or guarding Extremities: extremities normal, atraumatic, no cyanosis or edema  Results for orders placed or performed during the hospital encounter of 05/18/14 (from the past 48 hour(s))  POC occult blood, ED  Provider will collect     Status: Abnormal   Collection Time: 05/18/14 11:55 AM  Result Value Ref Range   Fecal Occult Bld POSITIVE (A) NEGATIVE  CBC with Differential     Status: Abnormal   Collection Time: 05/18/14 12:02 PM  Result Value Ref Range   WBC 15.9 (H) 4.0 - 10.5 K/uL   RBC 4.44 4.22 - 5.81 MIL/uL   Hemoglobin 13.9 13.0 - 17.0 g/dL   HCT 42.5 39.0 - 52.0 %   MCV 95.7 78.0 - 100.0 fL   MCH 31.3 26.0 - 34.0 pg   MCHC 32.7 30.0 - 36.0 g/dL   RDW 13.8 11.5 - 15.5 %   Platelets 227 150 - 400 K/uL   Neutrophils Relative % 92 (H) 43 - 77 %   Neutro Abs 14.6 (H) 1.7 - 7.7 K/uL   Lymphocytes Relative 4 (L) 12 - 46 %   Lymphs Abs 0.6 (L) 0.7 - 4.0 K/uL   Monocytes Relative 4 3 - 12  %   Monocytes Absolute 0.7 0.1 - 1.0 K/uL   Eosinophils Relative 0 0 - 5 %   Eosinophils Absolute 0.0 0.0 - 0.7 K/uL   Basophils Relative 0 0 - 1 %   Basophils Absolute 0.0 0.0 - 0.1 K/uL  Comprehensive metabolic panel     Status: Abnormal   Collection Time: 05/18/14 12:02 PM  Result Value Ref Range   Sodium 154 (H) 137 - 147 mEq/L   Potassium 4.2 3.7 - 5.3 mEq/L   Chloride 114 (H) 96 - 112 mEq/L   CO2 19 19 - 32 mEq/L   Glucose, Bld 130 (H) 70 - 99 mg/dL   BUN 42 (H) 6 - 23 mg/dL   Creatinine, Ser 3.31 (H) 0.50 - 1.35 mg/dL   Calcium 10.5 8.4 - 10.5 mg/dL   Total Protein 8.5 (H) 6.0 - 8.3 g/dL   Albumin 4.4 3.5 - 5.2 g/dL   AST 35 0 - 37 U/L   ALT 19 0 - 53 U/L   Alkaline Phosphatase 63 39 - 117 U/L   Total Bilirubin 0.6 0.3 - 1.2 mg/dL   GFR calc non Af Amer 16 (L) >90 mL/min   GFR calc Af Amer 19 (L) >90 mL/min    Comment: (NOTE) The eGFR has been calculated using the CKD EPI equation. This calculation has not been validated in all clinical situations. eGFR's persistently <90 mL/min signify possible Chronic Kidney Disease.    Anion gap 21 (H) 5 - 15  Lipase, blood     Status: None   Collection Time: 05/18/14 12:02 PM  Result Value Ref Range   Lipase 31 11 - 59 U/L  Protime-INR     Status: None   Collection Time: 05/18/14 12:02 PM  Result Value Ref Range   Prothrombin Time 14.8 11.6 - 15.2 seconds   INR 1.15 0.00 - 1.49  APTT     Status: None   Collection Time: 05/18/14 12:02 PM  Result Value Ref Range   aPTT 30 24 - 37 seconds  Type and screen     Status: None   Collection Time: 05/18/14 12:02 PM  Result Value Ref Range   ABO/RH(D) O POS    Antibody Screen NEG    Sample Expiration 05/21/2014   Troponin I     Status: None   Collection Time: 05/18/14 12:02 PM  Result Value Ref Range   Troponin I <0.30 <0.30 ng/mL    Comment:  Due to the release kinetics of cTnI, a negative result within the first hours of the onset of symptoms does not rule  out myocardial infarction with certainty. If myocardial infarction is still suspected, repeat the test at appropriate intervals.   ABO/Rh     Status: None   Collection Time: 05/18/14 12:02 PM  Result Value Ref Range   ABO/RH(D) O POS   Urinalysis, Routine w reflex microscopic     Status: Abnormal   Collection Time: 05/18/14  2:12 PM  Result Value Ref Range   Color, Urine YELLOW YELLOW   APPearance CLEAR CLEAR   Specific Gravity, Urine 1.011 1.005 - 1.030   pH 6.0 5.0 - 8.0   Glucose, UA NEGATIVE NEGATIVE mg/dL   Hgb urine dipstick LARGE (A) NEGATIVE   Bilirubin Urine NEGATIVE NEGATIVE   Ketones, ur 15 (A) NEGATIVE mg/dL   Protein, ur 30 (A) NEGATIVE mg/dL   Urobilinogen, UA 0.2 0.0 - 1.0 mg/dL   Nitrite NEGATIVE NEGATIVE   Leukocytes, UA NEGATIVE NEGATIVE  Urine microscopic-add on     Status: Abnormal   Collection Time: 05/18/14  2:12 PM  Result Value Ref Range   Squamous Epithelial / LPF RARE RARE   WBC, UA 0-2 <3 WBC/hpf   RBC / HPF 3-6 <3 RBC/hpf   Bacteria, UA RARE RARE   Casts HYALINE CASTS (A) NEGATIVE    Comment: GRANULAR CAST  Comprehensive metabolic panel     Status: Abnormal   Collection Time: 05/19/14  4:20 AM  Result Value Ref Range   Sodium 155 (H) 137 - 147 mEq/L   Potassium 4.4 3.7 - 5.3 mEq/L    Comment: HEMOLYSIS AT THIS LEVEL MAY AFFECT RESULT   Chloride 117 (H) 96 - 112 mEq/L   CO2 19 19 - 32 mEq/L   Glucose, Bld 106 (H) 70 - 99 mg/dL   BUN 45 (H) 6 - 23 mg/dL   Creatinine, Ser 3.18 (H) 0.50 - 1.35 mg/dL   Calcium 10.0 8.4 - 10.5 mg/dL   Total Protein 8.1 6.0 - 8.3 g/dL   Albumin 4.1 3.5 - 5.2 g/dL   AST 66 (H) 0 - 37 U/L    Comment: HEMOLYSIS AT THIS LEVEL MAY AFFECT RESULT   ALT 28 0 - 53 U/L   Alkaline Phosphatase 64 39 - 117 U/L   Total Bilirubin 0.8 0.3 - 1.2 mg/dL   GFR calc non Af Amer 17 (L) >90 mL/min   GFR calc Af Amer 20 (L) >90 mL/min    Comment: (NOTE) The eGFR has been calculated using the CKD EPI equation. This calculation has  not been validated in all clinical situations. eGFR's persistently <90 mL/min signify possible Chronic Kidney Disease.    Anion gap 19 (H) 5 - 15  CBC     Status: Abnormal   Collection Time: 05/19/14  4:20 AM  Result Value Ref Range   WBC 18.6 (H) 4.0 - 10.5 K/uL   RBC 4.45 4.22 - 5.81 MIL/uL   Hemoglobin 13.3 13.0 - 17.0 g/dL   HCT 42.2 39.0 - 52.0 %   MCV 94.8 78.0 - 100.0 fL   MCH 29.9 26.0 - 34.0 pg   MCHC 31.5 30.0 - 36.0 g/dL   RDW 14.0 11.5 - 15.5 %   Platelets 244 150 - 400 K/uL  Protime-INR     Status: None   Collection Time: 05/19/14  4:20 AM  Result Value Ref Range   Prothrombin Time 14.7 11.6 - 15.2 seconds  INR 1.14 0.00 - 8.98  Basic metabolic panel     Status: Abnormal   Collection Time: 05/19/14  9:03 AM  Result Value Ref Range   Sodium 156 (H) 137 - 147 mEq/L   Potassium 4.0 3.7 - 5.3 mEq/L   Chloride 118 (H) 96 - 112 mEq/L   CO2 19 19 - 32 mEq/L   Glucose, Bld 127 (H) 70 - 99 mg/dL   BUN 45 (H) 6 - 23 mg/dL   Creatinine, Ser 3.16 (H) 0.50 - 1.35 mg/dL   Calcium 10.2 8.4 - 10.5 mg/dL   GFR calc non Af Amer 17 (L) >90 mL/min   GFR calc Af Amer 20 (L) >90 mL/min    Comment: (NOTE) The eGFR has been calculated using the CKD EPI equation. This calculation has not been validated in all clinical situations. eGFR's persistently <90 mL/min signify possible Chronic Kidney Disease.    Anion gap 19 (H) 5 - 15   Ct Abdomen Pelvis Wo Contrast  05/18/2014   CLINICAL DATA:  Constipation for the past 3 days. Nausea and vomiting for the past 2 days. Worsening abdominal pain for the past 2 days.  EXAM: CT ABDOMEN AND PELVIS WITHOUT CONTRAST  TECHNIQUE: Multidetector CT imaging of the abdomen and pelvis was performed following the standard protocol without IV contrast.  COMPARISON:  None.  FINDINGS: Small sliding hiatal hernia. Calcified granulomata in the liver. Unremarkable non contrasted appearance of the spleen, pancreas, adrenal glands, urinary bladder and prostate  gland.  1.2 cm gallstone in the gallbladder. No gallbladder wall thickening or pericholecystic fluid. 1.2 cm mid left renal cyst or calyceal diverticulum with dependent calcific density. 6 mm hemorrhagic lower pole left renal cyst. Both kidneys are somewhat small.  Scattered colonic diverticula without evidence of diverticulitis. No enlarged lymph nodes. Atheromatous arterial calcifications. Normal amount of stool in the colon. Mild bilateral lower lobe cylindrical bronchiectasis. Lumbar and lower thoracic spine degenerative changes and mild scoliosis.  IMPRESSION: 1. No acute abnormality. 2. Small sliding hiatal hernia. 3. Cholelithiasis. 4. Mild bilateral lower lobe cylindrical bronchiectasis.   Electronically Signed   By: Enrique Sack M.D.   On: 05/18/2014 14:08    Assessment: Vomiting versus dysphagia, overall does not sound like a food impaction but cannot be sure. Plan:  Will observe on clear liquids today. If he cannot hold anything down will proceed with either EGD or barium swallow.  Tahir Blank C 05/19/2014, 10:22 AM

## 2014-05-19 NOTE — Progress Notes (Signed)
Patient wanted apple juice this am. RN gave patient 4oz of apple juice. Had no episodes of vomiting til after drinking . Emesis appears clear and mucous. Pt without any complaint at this time.

## 2014-05-20 ENCOUNTER — Inpatient Hospital Stay (HOSPITAL_COMMUNITY): Payer: Medicare Other

## 2014-05-20 LAB — BASIC METABOLIC PANEL
Anion gap: 24 — ABNORMAL HIGH (ref 5–15)
Anion gap: 22 — ABNORMAL HIGH (ref 5–15)
BUN: 58 mg/dL — AB (ref 6–23)
BUN: 64 mg/dL — AB (ref 6–23)
CHLORIDE: 116 meq/L — AB (ref 96–112)
CHLORIDE: 112 meq/L (ref 96–112)
CO2: 16 meq/L — AB (ref 19–32)
CO2: 16 meq/L — AB (ref 19–32)
CREATININE: 3.98 mg/dL — AB (ref 0.50–1.35)
CREATININE: 4.33 mg/dL — AB (ref 0.50–1.35)
Calcium: 10.4 mg/dL (ref 8.4–10.5)
Calcium: 9.8 mg/dL (ref 8.4–10.5)
GFR calc Af Amer: 15 mL/min — ABNORMAL LOW (ref >90)
GFR calc Af Amer: 13 mL/min — ABNORMAL LOW (ref >90)
GFR calc non Af Amer: 13 mL/min — ABNORMAL LOW (ref >90)
GFR calc non Af Amer: 12 mL/min — ABNORMAL LOW (ref >90)
GLUCOSE: 125 mg/dL — AB (ref 70–99)
GLUCOSE: 177 mg/dL — AB (ref 70–99)
POTASSIUM: 4.1 meq/L (ref 3.7–5.3)
POTASSIUM: 3.7 meq/L (ref 3.7–5.3)
Sodium: 156 mEq/L — ABNORMAL HIGH (ref 137–147)
Sodium: 150 mEq/L — ABNORMAL HIGH (ref 137–147)

## 2014-05-20 LAB — CBC WITH DIFFERENTIAL/PLATELET
BASOS PCT: 0 % (ref 0–1)
Basophils Absolute: 0 10*3/uL (ref 0.0–0.1)
EOS PCT: 0 % (ref 0–5)
Eosinophils Absolute: 0 10*3/uL (ref 0.0–0.7)
HCT: 45.2 % (ref 39.0–52.0)
HEMOGLOBIN: 14.3 g/dL (ref 13.0–17.0)
LYMPHS ABS: 1 10*3/uL (ref 0.7–4.0)
Lymphocytes Relative: 5 % — ABNORMAL LOW (ref 12–46)
MCH: 30.2 pg (ref 26.0–34.0)
MCHC: 31.6 g/dL (ref 30.0–36.0)
MCV: 95.6 fL (ref 78.0–100.0)
MONOS PCT: 5 % (ref 3–12)
Monocytes Absolute: 1 10*3/uL (ref 0.1–1.0)
NEUTROS PCT: 90 % — AB (ref 43–77)
Neutro Abs: 19 10*3/uL — ABNORMAL HIGH (ref 1.7–7.7)
Platelets: 215 10*3/uL (ref 150–400)
RBC: 4.73 MIL/uL (ref 4.22–5.81)
RDW: 14.1 % (ref 11.5–15.5)
WBC: 21 10*3/uL — ABNORMAL HIGH (ref 4.0–10.5)

## 2014-05-20 LAB — COMPREHENSIVE METABOLIC PANEL
ALK PHOS: 73 U/L (ref 39–117)
ALT: 58 U/L — ABNORMAL HIGH (ref 0–53)
AST: 72 U/L — ABNORMAL HIGH (ref 0–37)
Albumin: 3.9 g/dL (ref 3.5–5.2)
Anion gap: 19 — ABNORMAL HIGH (ref 5–15)
BUN: 51 mg/dL — ABNORMAL HIGH (ref 6–23)
CO2: 18 meq/L — AB (ref 19–32)
Calcium: 10.3 mg/dL (ref 8.4–10.5)
Chloride: 121 mEq/L — ABNORMAL HIGH (ref 96–112)
Creatinine, Ser: 3.29 mg/dL — ABNORMAL HIGH (ref 0.50–1.35)
GFR, EST AFRICAN AMERICAN: 19 mL/min — AB (ref >90)
GFR, EST NON AFRICAN AMERICAN: 16 mL/min — AB (ref >90)
GLUCOSE: 118 mg/dL — AB (ref 70–99)
POTASSIUM: 4.3 meq/L (ref 3.7–5.3)
Sodium: 158 mEq/L — ABNORMAL HIGH (ref 137–147)
Total Bilirubin: 1.4 mg/dL — ABNORMAL HIGH (ref 0.3–1.2)
Total Protein: 8 g/dL (ref 6.0–8.3)

## 2014-05-20 LAB — AMMONIA: AMMONIA: 36 umol/L (ref 11–60)

## 2014-05-20 LAB — SODIUM, URINE, RANDOM: Sodium, Ur: 20 mEq/L

## 2014-05-20 LAB — LACTIC ACID, PLASMA: Lactic Acid, Venous: 2.5 mmol/L — ABNORMAL HIGH (ref 0.5–2.2)

## 2014-05-20 LAB — OSMOLALITY, URINE: Osmolality, Ur: 263 mOsm/kg — ABNORMAL LOW (ref 390–1090)

## 2014-05-20 LAB — VALPROIC ACID LEVEL: Valproic Acid Lvl: 14.4 ug/mL — ABNORMAL LOW (ref 50.0–100.0)

## 2014-05-20 MED ORDER — MIRABEGRON ER 25 MG PO TB24
25.0000 mg | ORAL_TABLET | Freq: Every day | ORAL | Status: DC
  Administered 2014-05-21 – 2014-05-28 (×8): 25 mg via ORAL
  Filled 2014-05-20 (×8): qty 1

## 2014-05-20 MED ORDER — FUROSEMIDE 10 MG/ML IJ SOLN
40.0000 mg | INTRAMUSCULAR | Status: AC
  Administered 2014-05-20: 40 mg via INTRAVENOUS
  Filled 2014-05-20: qty 4

## 2014-05-20 MED ORDER — HYDRALAZINE HCL 20 MG/ML IJ SOLN: 10.0000 mg | Freq: Four times a day (QID) | INTRAMUSCULAR | Status: DC | PRN

## 2014-05-20 MED ORDER — TRIHEXYPHENIDYL HCL 2 MG PO TABS
2.0000 mg | ORAL_TABLET | Freq: Every day | ORAL | Status: DC
  Administered 2014-05-21 – 2014-05-28 (×8): 2 mg via ORAL
  Filled 2014-05-20 (×8): qty 1

## 2014-05-20 MED ORDER — PIPERACILLIN-TAZOBACTAM IN DEX 2-0.25 GM/50ML IV SOLN
2.2500 g | Freq: Three times a day (TID) | INTRAVENOUS | Status: DC
  Administered 2014-05-20 – 2014-05-25 (×15): 2.25 g via INTRAVENOUS
  Filled 2014-05-20 (×19): qty 50

## 2014-05-20 MED ORDER — FLEET ENEMA 7-19 GM/118ML RE ENEM
1.0000 | ENEMA | Freq: Once | RECTAL | Status: AC
  Administered 2014-05-20: 1 via RECTAL
  Filled 2014-05-20: qty 1

## 2014-05-20 MED ORDER — WHITE PETROLATUM GEL
Status: AC
  Administered 2014-05-20: 0.2
  Filled 2014-05-20: qty 5

## 2014-05-20 NOTE — Plan of Care (Signed)
Problem: Phase I Progression Outcomes Goal: Voiding-avoid urinary catheter unless indicated Outcome: Completed/Met Date Met:  05/20/14 Goal: Other Phase I Outcomes/Goals Outcome: Not Applicable Date Met:  05/20/14     

## 2014-05-20 NOTE — Progress Notes (Signed)
[facility] Progress Note  Subjective: The patient is spitting up. He states he's been doing this for about a week. There is a history of esophageal stricture in the past.  Objective: Vital signs in last 24 hours: Temp:  [98.5 F (36.9 C)-99.3 F (37.4 C)] 99.3 F (37.4 C) (11/29 2115) Pulse Rate:  [89-105] 89 (11/30 0554) Resp:  [16] 16 (11/30 0554) BP: (150-177)/(90-110) 155/103 mmHg (11/30 0554) SpO2:  [89 %-94 %] 94 % (11/30 0554) Weight change:    PE:  He is spitting up  Lab Results: Results for orders placed or performed during the hospital encounter of 05/18/14 (from the past 24 hour(s))  Comprehensive metabolic panel     Status: Abnormal   Collection Time: 05/20/14  4:39 AM  Result Value Ref Range   Sodium 158 (H) 137 - 147 mEq/L   Potassium 4.3 3.7 - 5.3 mEq/L   Chloride 121 (H) 96 - 112 mEq/L   CO2 18 (L) 19 - 32 mEq/L   Glucose, Bld 118 (H) 70 - 99 mg/dL   BUN 51 (H) 6 - 23 mg/dL   Creatinine, Ser 8.293.29 (H) 0.50 - 1.35 mg/dL   Calcium 56.210.3 8.4 - 13.010.5 mg/dL   Total Protein 8.0 6.0 - 8.3 g/dL   Albumin 3.9 3.5 - 5.2 g/dL   AST 72 (H) 0 - 37 U/L   ALT 58 (H) 0 - 53 U/L   Alkaline Phosphatase 73 39 - 117 U/L   Total Bilirubin 1.4 (H) 0.3 - 1.2 mg/dL   GFR calc non Af Amer 16 (L) >90 mL/min   GFR calc Af Amer 19 (L) >90 mL/min   Anion gap 19 (H) 5 - 15  CBC with Differential     Status: Abnormal   Collection Time: 05/20/14  4:39 AM  Result Value Ref Range   WBC 21.0 (H) 4.0 - 10.5 K/uL   RBC 4.73 4.22 - 5.81 MIL/uL   Hemoglobin 14.3 13.0 - 17.0 g/dL   HCT 86.545.2 78.439.0 - 69.652.0 %   MCV 95.6 78.0 - 100.0 fL   MCH 30.2 26.0 - 34.0 pg   MCHC 31.6 30.0 - 36.0 g/dL   RDW 29.514.1 28.411.5 - 13.215.5 %   Platelets 215 150 - 400 K/uL   Neutrophils Relative % 90 (H) 43 - 77 %   Neutro Abs 19.0 (H) 1.7 - 7.7 K/uL   Lymphocytes Relative 5 (L) 12 - 46 %   Lymphs Abs 1.0 0.7 - 4.0 K/uL   Monocytes Relative 5 3 - 12 %   Monocytes Absolute 1.0 0.1 - 1.0 K/uL   Eosinophils Relative 0 0 - 5 %   Eosinophils Absolute 0.0 0.0 - 0.7 K/uL   Basophils Relative 0 0 - 1 %   Basophils Absolute 0.0 0.0 - 0.1 K/uL  Sodium, urine, random     Status: None   Collection Time: 05/20/14  8:17 AM  Result Value Ref Range   Sodium, Ur 20 mEq/L  Osmolality, urine     Status: Abnormal   Collection Time: 05/20/14  8:17 AM  Result Value Ref Range   Osmolality, Ur 263 (L) 390 - 1090 mOsm/kg    Studies/Results: Dg Chest 2 View  05/19/2014   CLINICAL DATA:  Pneumonia  EXAM: CHEST  2 VIEW  COMPARISON:  01/31/2013  FINDINGS: Cardiomediastinal silhouette is stable. No acute infiltrate or pleural effusion. No pulmonary edema. Osteopenia and mild degenerative changes mid thoracic spine. Atherosclerotic calcifications of thoracic aorta.  IMPRESSION: No  active cardiopulmonary disease.   Electronically Signed   By: Natasha MeadLiviu  Pop M.D.   On: 05/19/2014 16:44      Assessment: Question dysphagia  Plan: Obtain barium swallow    Zahi Plaskett F 05/20/2014, 12:16 PM

## 2014-05-20 NOTE — Progress Notes (Signed)
Patient reviewed Stable but couldn't perform test Keep NPO Suction Get ammonia  Pleas KochJai Nashly Olsson, MD [facility] [phone]

## 2014-05-20 NOTE — Consult Note (Signed)
Fernando Ruiz is an 80 y.o. male referred by Dr Mahala MenghiniSamtani   Chief Complaint: Acute on CKD 3/4, hypernatremia HPI: 80yo WM admitted 05/18/14 for persistent N/V x 3days.  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 Last Scr 5/15 was 2.1.  On admission Scr 3.18 with SNa of 155.  Has been on D5W at 100cc/hr but Scr unimproved and SNa sl higher at 158.  I/O's not recorded.  CT abd showed smallish kidneys with no hydro.  Past Medical History  Diagnosis Date  . Resting tremor   . Memory loss   . Unspecified cerebral artery occlusion with cerebral infarction   . Secondary parkinsonism   . High cholesterol   . Bipolar disorder   . Hypothyroidism   . Dementia     Past Surgical History  Procedure Laterality Date  . None      Family History  Problem Relation Age of Onset  . Stroke Father   . Cancer Mother   . Cancer Brother   . Heart disease Sister   . Diabetes Sister    Social History:  reports that he has never smoked. He has never used smokeless tobacco. He reports that he does not drink alcohol or use illicit drugs.  Allergies:  Allergies  Allergen Reactions  . Penicillins Rash    Medications Prior to Admission  Medication Sig Dispense Refill  . alendronate (FOSAMAX) 70 MG tablet 70 mg once a week.     Marland Kitchen. amLODipine (NORVASC) 5 MG tablet Take 5 mg by mouth daily.    . clonazePAM (KLONOPIN) 1 MG tablet Take 1 mg by mouth daily.     . divalproex (DEPAKOTE ER) 250 MG 24 hr tablet Take 250 mg by mouth daily.     Marland Kitchen. donepezil (ARICEPT) 10 MG tablet Take 1 tablet (10 mg total) by mouth daily. 90 tablet 3  . finasteride (PROSCAR) 5 MG tablet Take 5 mg by mouth daily.     Marland Kitchen. levothyroxine (SYNTHROID, LEVOTHROID) 50 MCG tablet Take 50 mcg by mouth daily.     Marland Kitchen. MYRBETRIQ 25 MG TB24 tablet Take 25 mg by mouth daily.     Marland Kitchen. omeprazole (PRILOSEC) 40 MG capsule Take 40 mg by mouth daily.     . risperiDONE (RISPERDAL) 1 MG tablet Take 1 mg by mouth daily.      . simvastatin (ZOCOR) 40 MG tablet Take 40 mg by mouth daily.     . trihexyphenidyl (ARTANE) 2 MG tablet Take 1 tablet (2 mg total) by mouth daily. 30 tablet 11     Lab Results: UA: 30 protein, + ketones, 0-2 wbc, 3-6 RBC   Recent Labs  05/18/14 1202 05/19/14 0420 05/20/14 0439  WBC 15.9* 18.6* 21.0*  HGB 13.9 13.3 14.3  HCT 42.5 42.2 45.2  PLT 227 244 215   BMET  Recent Labs  05/19/14 0420 05/19/14 0903 05/20/14 0439  NA 155* 156* 158*  K 4.4 4.0 4.3  CL 117* 118* 121*  CO2 19 19 18*  GLUCOSE 106* 127* 118*  BUN 45* 45* 51*  CREATININE 3.18* 3.16* 3.29*  CALCIUM 10.0 10.2 10.3   LFT  Recent Labs  05/20/14 0439  PROT 8.0  ALBUMIN 3.9  AST 72*  ALT 58*  ALKPHOS 73  BILITOT 1.4*   Dg Chest 2 View  05/19/2014   CLINICAL DATA:  Pneumonia  EXAM: CHEST  2 VIEW  COMPARISON:  01/31/2013  FINDINGS: Cardiomediastinal silhouette  is stable. No acute infiltrate or pleural effusion. No pulmonary edema. Osteopenia and mild degenerative changes mid thoracic spine. Atherosclerotic calcifications of thoracic aorta.  IMPRESSION: No active cardiopulmonary disease.   Electronically Signed   By: Natasha MeadLiviu  Pop M.D.   On: 05/19/2014 16:44    ROS: No Change in vision No SOB  No CP No abd pain No dysuria No new arthritic CO   PHYSICAL EXAM: Blood pressure 103/70, pulse 95, temperature 99.3 F (37.4 C), temperature source Oral, resp. rate 18, height 5\' 8"  (1.727 m), weight 71.215 kg (157 lb), SpO2 82 %. HEENT: PERRLA EOMI NECK:No JVD LUNGS:clear CARDIAC:RRR wo MRG ABD:+ BS NTND No HSM EXT:No edema Skin: Decreased turgor NEURO:CNI No asterixis.  Not oriented but this is his baseline  Assessment: 1. Acute on CKD 3 most likely secondary to volume depletion +/- mild ATN 2. Hypernatremia secondary to volume depletion.  He may be more prone to this as he could have underlying nephrogenic DI from hx of  Lithium use PLAN: 1. Needs more free water and so will increase D5W to  200cc/hr 2. Needs a foley so can accurately measure UO. 3. Daily Scr and Sna 4. Avoid lasix   [place] Droll T 05/20/2014, 2:00 PM

## 2014-05-20 NOTE — Progress Notes (Signed)
Patient was unable to do the barium swallow. The report was that he was too sleepy. He is wide awake at the present time. I discussed with him and his wife and family. To further evaluate the spitting up and dysphagia I think the next step is endoscopy. According to his wife he has had esophageal dilations in the past and the last time was 3 years ago by Dr Madilyn FiremanHayes. Will go ahead and schedule him for EGD with possible dilation tomorrow.

## 2014-05-20 NOTE — Progress Notes (Signed)
ANTIBIOTIC CONSULT NOTE - INITIAL  Pharmacy Consult: Zosyn Indication: Aspiration Pneumonia  Allergies  Allergen Reactions  . Penicillins Rash    Patient Measurements: Height: 5\' 8"  (172.7 cm) Weight: 157 lb (71.215 kg) IBW/kg (Calculated) : 68.4 Adjusted Body Weight:   Vital Signs: BP: 103/70 mmHg (11/30 1325) Pulse Rate: 95 (11/30 1325) Intake/Output from previous day:   Intake/Output from this shift: Total I/O In: 240 [P.O.:240] Out: 400 [Urine:400]  Labs:  Recent Labs  05/18/14 1202 05/19/14 0420 05/19/14 0903 05/20/14 0439  WBC 15.9* 18.6*  --  21.0*  HGB 13.9 13.3  --  14.3  PLT 227 244  --  215  CREATININE 3.31* 3.18* 3.16* 3.29*   Estimated Creatinine Clearance: 17 mL/min (by C-G formula based on Cr of 3.29). No results for input(s): VANCOTROUGH, VANCOPEAK, VANCORANDOM, GENTTROUGH, GENTPEAK, GENTRANDOM, TOBRATROUGH, TOBRAPEAK, TOBRARND, AMIKACINPEAK, AMIKACINTROU, AMIKACIN in the last 72 hours.   Microbiology: No results found for this or any previous visit (from the past 720 hour(s)).  Medical History: Past Medical History  Diagnosis Date  . Resting tremor   . Memory loss   . Unspecified cerebral artery occlusion with cerebral infarction   . Secondary parkinsonism   . High cholesterol   . Bipolar disorder   . Hypothyroidism   . Dementia     Medications:  Scheduled:  . divalproex  250 mg Oral Daily  . donepezil  10 mg Oral Daily  . finasteride  5 mg Oral Daily  . heparin  5,000 Units Subcutaneous 3 times per day  . levothyroxine  50 mcg Oral QAC breakfast  . mirabegron ER  25 mg Oral Daily  . ondansetron (ZOFRAN) IV  4 mg Intravenous 4 times per day  . pantoprazole  40 mg Oral Q breakfast  . piperacillin-tazobactam (ZOSYN)  IV  2.25 g Intravenous 3 times per day  . trihexyphenidyl  2 mg Oral Daily   Assessment: 80 yr old male admitted on 11/28 after a couple of days of N/V. Now has elevated WBC and is thought to have aspiration  pneumonia. CrCl is 17 ml/min. Zosyn has been ordered. Pt has pcn allergy with rash as his reaction.  Goal of Therapy:  Resolution of infection (aspiration pneumonia.  Plan:  Zosyn 2.25 Gm IV q8h.  Follow renal function and adjust dose as appropriate.   Eugene Garnetotter, Veona Bittman Sue 05/20/2014,3:22 PM

## 2014-05-20 NOTE — Progress Notes (Signed)
Fernando Ruiz:[id number] DOB: 11-12-1930 DOA: 05/18/2014 PCP: Tivis Ringer, MD  Brief narrative:  80 year old male with history of secondary parkinsonism, potential dementia, see daily stage II, multiple esophageal dilatations in past 4 years, psychiatric disorders, osteoporosis, hypothyroidism, prior CVA admitted with intractable nausea vomiting 11/29.  He continued to have n/v and GI consulted. Renal function declined and worsening hypernatremia prompted nephrology input  Past medical history-As per Problem list Chart reviewed as below- nad  Consultants:   Dr. Amedeo Plenty GI  Procedures:  None yet  Antibiotics:  none   Subjective   Multiple episodes emesis clear liquids.  Not really able to keep anything down. No overt pain but visibly weaker than prior   Objective    Interim History:   Telemetry:    Objective: Filed Vitals:   05/19/14 1610 05/19/14 2115 05/20/14 0552 05/20/14 0554  BP: 170/109 164/100 177/110 155/103  Pulse: 99 105 95 89  Temp: 98.5 F (36.9 C) 99.3 F (37.4 C)    TempSrc: Oral Oral    Resp: '16 16 16 16  ' Height:      Weight:      SpO2: 89% 92% 94% 94%    Intake/Output Summary (Last 24 hours) at 05/20/14 1310 Last data filed at 05/20/14 1010  Gross per 24 hour  Intake    240 ml  Output    400 ml  Net   -160 ml    Exam:  General: eomi, ncat Cardiovascular: s1 s 2no m/r/g Respiratory: clear no added sound Abdomen: soft nt, no rebound or gaurding  Data Reviewed: Basic Metabolic Panel:  Recent Labs Lab 05/18/14 1202 05/19/14 0420 05/19/14 0903 05/20/14 0439  NA 154* 155* 156* 158*  K 4.2 4.4 4.0 4.3  CL 114* 117* 118* 121*  CO2 '19 19 19 ' 18*  GLUCOSE 130* 106* 127* 118*  BUN 42* 45* 45* 51*  CREATININE 3.31* 3.18* 3.16* 3.29*  CALCIUM 10.5 10.0 10.2 10.3   Liver Function Tests:  Recent Labs Lab 05/18/14 1202 05/19/14 0420 05/20/14 0439  AST 35 66* 72*  ALT 19 28 58*  ALKPHOS 63 64 73  BILITOT 0.6 0.8  1.4*  PROT 8.5* 8.1 8.0  ALBUMIN 4.4 4.1 3.9    Recent Labs Lab 05/18/14 1202  LIPASE 31   No results for input(s): AMMONIA in the last 168 hours. CBC:  Recent Labs Lab 05/18/14 1202 05/19/14 0420 05/20/14 0439  WBC 15.9* 18.6* 21.0*  NEUTROABS 14.6*  --  19.0*  HGB 13.9 13.3 14.3  HCT 42.5 42.2 45.2  MCV 95.7 94.8 95.6  PLT 227 244 215   Cardiac Enzymes:  Recent Labs Lab 05/18/14 1202  TROPONINI <0.30   BNP: Invalid input(s): POCBNP CBG: No results for input(s): GLUCAP in the last 168 hours.  No results found for this or any previous visit (from the past 240 hour(s)).   Studies:              All Imaging reviewed and is as per above notation   Scheduled Meds: . amLODipine  5 mg Oral Daily  . clonazePAM  1 mg Oral Daily  . divalproex  250 mg Oral Daily  . donepezil  10 mg Oral Daily  . finasteride  5 mg Oral Daily  . heparin  5,000 Units Subcutaneous 3 times per day  . levothyroxine  50 mcg Oral QAC breakfast  . mirabegron ER  25 mg Oral Daily  . ondansetron (ZOFRAN) IV  4 mg Intravenous 4 times  per day  . pantoprazole  40 mg Oral Q breakfast  . risperiDONE  1 mg Oral Daily  . trihexyphenidyl  2 mg Oral Daily   Continuous Infusions: . dextrose 50 mL/hr at 05/19/14 0530  . dextrose 50 mL (05/18/14 1747)     Assessment/Plan:  1. N/v-unclear etiology-potentially globus vs stricture.  Appreciate GI input-Get barium swallow.   Might need esophageal dilatation 2. Parkinsons-continue Trihexphenidyl 2 mg daily, Aricept 10 daily, Mirabegron ER 25 daily.  Consider addition Carbidopa as OP. 3. Hypernatremia-still elevated sodium.  Rpt labs confirm this. Continue D5 water 50 cc/hour-->100 cc/hr onn 11/30-this is worsening-U OSM 263 suggestive either primary polydipsia or Diabetes insipidus-h/o confirmed with wife that has been drinking ++ fluids since Wednesday when this all started.  Appreciate nephrology input in advance-?Vasopressin test?.  Strict i/o.  Rpt  Bmet q 8  Given lasix x 1 11/30 4. Mild metabolic acidosis-CO2 18.  Rpt labs am.  Could be from either renal or infectious causes.  Obtain lactic acid 5. Leukocytosis-unclear etiology- CXR shows no overt PNA [rule out aspiration given h/o n/v].  Hold antibiotics for now but low grade temp noted 11/30 am  6. Elevated AST-unclear etiology-rpt labs show mildly elevated transaminases, Bili has bumped upwards to 1.6 and alk phos a little higher.  Monitor LFTs and get INR in am.  Will consider US abdomen as well as concern for cholelithiasis 7. H/o CVA-stable 8. Bipolar- Riperidon2e 1 mg daily re-ordered as well at clonazepam 1 mg daily 9. BPH-continue Finsteride 5 mg daily 10. Htn-restarted amlodipine 5 daily.  Monitor trends.  Augment Rx if needed   Code Status: Full Family Communication:  Discussed with son at bedside and he understands Disposition Plan:  Inpatient pending resolution   Fernando Griffes, MD  [facility] Pager [phone] 05/20/2014, 1:10 PM    LOS: 2 days

## 2014-05-21 ENCOUNTER — Encounter (HOSPITAL_COMMUNITY)
Admission: EM | Disposition: A | Discharge status: Skilled Nursing Facility | Payer: Medicare Other | Source: Home / Self Care | Attending: Internal Medicine

## 2014-05-21 ENCOUNTER — Inpatient Hospital Stay (HOSPITAL_COMMUNITY): Payer: Medicare Other | Admitting: Certified Registered Nurse Anesthetist

## 2014-05-21 ENCOUNTER — Inpatient Hospital Stay (HOSPITAL_COMMUNITY): Payer: Medicare Other

## 2014-05-21 ENCOUNTER — Encounter (HOSPITAL_COMMUNITY): Payer: Self-pay | Admitting: Certified Registered Nurse Anesthetist

## 2014-05-21 HISTORY — PX: ESOPHAGOGASTRODUODENOSCOPY (EGD) WITH PROPOFOL: SHX5813

## 2014-05-21 LAB — COMPREHENSIVE METABOLIC PANEL
ALK PHOS: 74 U/L (ref 39–117)
ALT: 60 U/L — AB (ref 0–53)
AST: 46 U/L — ABNORMAL HIGH (ref 0–37)
Albumin: 3.3 g/dL — ABNORMAL LOW (ref 3.5–5.2)
Anion gap: 21 — ABNORMAL HIGH (ref 5–15)
BUN: 68 mg/dL — ABNORMAL HIGH (ref 6–23)
CHLORIDE: 108 meq/L (ref 96–112)
CO2: 19 meq/L (ref 19–32)
Calcium: 9.4 mg/dL (ref 8.4–10.5)
Creatinine, Ser: 4.64 mg/dL — ABNORMAL HIGH (ref 0.50–1.35)
GFR calc Af Amer: 12 mL/min — ABNORMAL LOW (ref >90)
GFR, EST NON AFRICAN AMERICAN: 11 mL/min — AB (ref >90)
GLUCOSE: 117 mg/dL — AB (ref 70–99)
POTASSIUM: 3.4 meq/L — AB (ref 3.7–5.3)
SODIUM: 148 meq/L — AB (ref 137–147)
Total Bilirubin: 1.2 mg/dL (ref 0.3–1.2)
Total Protein: 7.4 g/dL (ref 6.0–8.3)

## 2014-05-21 LAB — CBC WITH DIFFERENTIAL/PLATELET
Basophils Absolute: 0 10*3/uL (ref 0.0–0.1)
Basophils Relative: 0 % (ref 0–1)
EOS PCT: 1 % (ref 0–5)
Eosinophils Absolute: 0.2 10*3/uL (ref 0.0–0.7)
HEMATOCRIT: 42 % (ref 39.0–52.0)
HEMOGLOBIN: 13.4 g/dL (ref 13.0–17.0)
LYMPHS PCT: 7 % — AB (ref 12–46)
Lymphs Abs: 1.2 10*3/uL (ref 0.7–4.0)
MCH: 30 pg (ref 26.0–34.0)
MCHC: 31.9 g/dL (ref 30.0–36.0)
MCV: 94 fL (ref 78.0–100.0)
MONO ABS: 1 10*3/uL (ref 0.1–1.0)
MONOS PCT: 6 % (ref 3–12)
Neutro Abs: 13.5 10*3/uL — ABNORMAL HIGH (ref 1.7–7.7)
Neutrophils Relative %: 86 % — ABNORMAL HIGH (ref 43–77)
Platelets: 195 10*3/uL (ref 150–400)
RBC: 4.47 MIL/uL (ref 4.22–5.81)
RDW: 14.2 % (ref 11.5–15.5)
WBC: 15.9 10*3/uL — ABNORMAL HIGH (ref 4.0–10.5)

## 2014-05-21 LAB — PROTIME-INR
INR: 1.12 (ref 0.00–1.49)
Prothrombin Time: 14.6 seconds (ref 11.6–15.2)

## 2014-05-21 SURGERY — ESOPHAGOGASTRODUODENOSCOPY (EGD) WITH PROPOFOL
Anesthesia: Monitor Anesthesia Care

## 2014-05-21 MED ORDER — POTASSIUM CHLORIDE 20 MEQ/15ML (10%) PO SOLN
20.0000 meq | Freq: Once | ORAL | Status: AC
  Administered 2014-05-21: 20 meq via ORAL
  Filled 2014-05-21 (×3): qty 15

## 2014-05-21 MED ORDER — LACTATED RINGERS IV SOLN
INTRAVENOUS | Status: DC | PRN
  Administered 2014-05-21: 09:00:00 via INTRAVENOUS

## 2014-05-21 MED ORDER — CETYLPYRIDINIUM CHLORIDE 0.05 % MT LIQD
7.0000 mL | Freq: Two times a day (BID) | OROMUCOSAL | Status: DC
  Administered 2014-05-21 – 2014-05-27 (×14): 7 mL via OROMUCOSAL

## 2014-05-21 MED ORDER — PROPOFOL 10 MG/ML IV BOLUS
INTRAVENOUS | Status: DC | PRN
  Administered 2014-05-21: 20 mg via INTRAVENOUS
  Administered 2014-05-21: 10 mg via INTRAVENOUS
  Administered 2014-05-21 (×2): 20 mg via INTRAVENOUS
  Administered 2014-05-21 (×3): 10 mg via INTRAVENOUS
  Administered 2014-05-21: 30 mg via INTRAVENOUS
  Administered 2014-05-21: 20 mg via INTRAVENOUS
  Administered 2014-05-21: 10 mg via INTRAVENOUS
  Administered 2014-05-21: 20 mg via INTRAVENOUS
  Administered 2014-05-21 (×3): 10 mg via INTRAVENOUS
  Administered 2014-05-21 (×3): 20 mg via INTRAVENOUS
  Administered 2014-05-21: 10 mg via INTRAVENOUS

## 2014-05-21 MED ORDER — POTASSIUM CHLORIDE 20 MEQ PO PACK: 20.0000 meq | PACK | Freq: Once | ORAL | Status: DC

## 2014-05-21 NOTE — Evaluation (Signed)
Physical Therapy Evaluation Patient Details Name: Fernando MoatJack W Ghazi MRN: [id number] DOB: 06/30/1930 Today's Date: 05/21/2014   History of Present Illness  80 year old male with history of secondary parkinsonism, potential dementia, see daily stage II, multiple esophageal dilatations in past 4 years, psychiatric disorders, osteoporosis, hypothyroidism, prior CVA admitted with intractable nausea vomiting 11/29.  Clinical Impression  Pt admitted with the above complications. Pt currently with functional limitations due to the deficits listed below (see PT Problem List). Min-Mod assist for mobility including transfer to chair. Very difficult sequencing and requires max cues. States he lives with his son who is not available 24 hours/day. Pt will benefit from skilled PT to increase their independence and safety with mobility to allow discharge to the venue listed below.       Follow Up Recommendations SNF;Supervision/Assistance - 24 hour    Equipment Recommendations  Rolling walker with 5" wheels;3in1 (PT) ((If son confirms pt does not have these devices))    Recommendations for Other Services       Precautions / Restrictions Precautions Precautions: Fall Restrictions Weight Bearing Restrictions: No      Mobility  Bed Mobility Overal bed mobility: Needs Assistance Bed Mobility: Supine to Sit     Supine to sit: Min assist;HOB elevated     General bed mobility comments: Min assist for scoot with bed pad. Max VC for technique. Assist with truncal control to upright position.  Transfers Overall transfer level: Needs assistance Equipment used: Rolling walker (2 wheeled) Transfers: Sit to/from UGI CorporationStand;Stand Pivot Transfers Sit to Stand: Mod assist Stand pivot transfers: Mod assist       General transfer comment: Mod assist for boost to stand with cues for hand placement. Unable to stand upright. leans heavily over RW. Mod assist with pivot transfer to chair. No buckling of knees but  requires physical assist for walker placement and to facilitate steps with turn to chair and max VC for sequencing. Poor control with descent.   Ambulation/Gait                Stairs            Wheelchair Mobility    Modified Rankin (Stroke Patients Only)       Balance Overall balance assessment: Needs assistance Sitting-balance support: Single extremity supported Sitting balance-Leahy Scale: Poor     Standing balance support: Bilateral upper extremity supported Standing balance-Leahy Scale: Poor                               Pertinent Vitals/Pain Pain Assessment: No/denies pain    Home Living Family/patient expects to be discharged to:: Skilled nursing facility Living Arrangements: Children (son) Available Help at Discharge: Family;Available PRN/intermittently Type of Home: House Home Access: Level entry     Home Layout: One level Home Equipment: None      Prior Function Level of Independence: Needs assistance   Gait / Transfers Assistance Needed: States he ambulated without an assistive device  ADL's / Homemaking Assistance Needed: States his son would assist some with ADLs        Hand Dominance        Extremity/Trunk Assessment   Upper Extremity Assessment: Defer to OT evaluation           Lower Extremity Assessment: Generalized weakness         Communication   Communication: No difficulties  Cognition Arousal/Alertness: Awake/alert Behavior During Therapy: Flat affect Overall Cognitive Status: No family/caregiver  present to determine baseline cognitive functioning       Memory: Decreased short-term memory              General Comments General comments (skin integrity, edema, etc.): SpO2 88% on room air when PT entered room. Elevated to 94% with 3L during therapy HR 73    Exercises General Exercises - Lower Extremity Ankle Circles/Pumps: AAROM;Both;10 reps;Seated Quad Sets: AAROM;Strengthening;Both;10  reps;Seated      Assessment/Plan    PT Assessment Patient needs continued PT services  PT Diagnosis Difficulty walking;Generalized weakness   PT Problem List Decreased strength;Decreased range of motion;Decreased activity tolerance;Decreased balance;Decreased mobility;Decreased knowledge of use of DME;Decreased cognition;Decreased safety awareness;Decreased knowledge of precautions;Cardiopulmonary status limiting activity  PT Treatment Interventions DME instruction;Gait training;Functional mobility training;Therapeutic activities;Therapeutic exercise;Balance training;Neuromuscular re-education;Cognitive remediation;Patient/family education   PT Goals (Current goals can be found in the Care Plan section) Acute Rehab PT Goals Patient Stated Goal: Go home PT Goal Formulation: With patient Time For Goal Achievement: 06/04/14 Potential to Achieve Goals: Fair    Frequency Min 2X/week   Barriers to discharge Decreased caregiver support Pt states he lives with son who is not available 24/7    Co-evaluation               End of Session Equipment Utilized During Treatment: Gait belt;Oxygen Activity Tolerance: Patient limited by fatigue Patient left: in chair;with call bell/phone within reach;with chair alarm set Nurse Communication: Mobility status;Other (comment) (drop in SpO2)         Time: [id number] PT Time Calculation (min) (ACUTE ONLY): 24 min   Charges:   PT Evaluation $Initial PT Evaluation Tier I: 1 Procedure PT Treatments $Therapeutic Activity: 8-22 mins   PT G Codes:          [place], Teaira Croft S 05/21/2014, 5:35 PM  Sunday SpillersLogan Secor [place], [place] [phone]

## 2014-05-21 NOTE — Progress Notes (Signed)
Medicare Important Message given?  YES (If response is "NO", the following Medicare IM given date fields will be blank) Date Medicare IM given:   Medicare IM given by:  Nyasha Rahilly 

## 2014-05-21 NOTE — Op Note (Signed)
[facility] [address] [place], [id number]   ENDOSCOPY PROCEDURE REPORT  PATIENT: Fernando Ruiz, Fernando Ruiz  MR#: [id number] BIRTHDATE: March 30, 1931 , 83  yrs. old GENDER: male ENDOSCOPIST: Wandalee FerdinandSam Zaylyn Bergdoll, MD REFERRED BY: PROCEDURE DATE:  05/21/2014 PROCEDURE: ASA CLASS:     3 INDICATIONS:  dysphagia MEDICATIONS: propofol per anesthesia TOPICAL ANESTHETIC: none  DESCRIPTION OF PROCEDURE: After the risks benefits and alternatives of the procedure were thoroughly explained, informed consent was obtained.  The Pentax Gastroscope [id number] endoscope was introduced through the mouth and advanced to the esophagus where immediately upon entering the esophagus food was noted. It appeared that the entire esophagus was full of food. I cleared the esophagus using multiple Roth net's. It appeared that the obstruction from the food was in the distal portion where either carrots or sweet potatoes were present. These all had to be removed with multiple Roth net's. After this the scope was able to be advanced to the second portion of the duodenum , Without limitations.  The instrument was slowly withdrawn as the mucosa was fully examined. in the second portion of the duodenum the mucosa looked normal. In the bulb of the duodenum there were some small duodenal bulb ulcers. The gastric mucosa looked normal. The distal esophagus where the food had been impacted was ulcerated and showed evidence of ulcerative esophagitis. I did not see an obvious stricture.    The scope was then withdrawn from the patient and the procedure completed.  COMPLICATIONS: There were no immediate complications.  ENDOSCOPIC IMPRESSION:     esophageal food impaction  Duodenal ulcers  Ulcerative esophagitis from where the food impaction was located  RECOMMENDATIONS:     clear liquid diet today. Advance as tolerated. Continue PPI therapy for esophagitis and ulcers   eSigned:  Wandalee FerdinandSam Brookelynn Hamor, MD 05/21/2014  11:03 AM    CC:  CPT CODES: ICD CODES:  The ICD and CPT codes recommended by this software are interpretations from the data that the clinical staff has captured with the software.  The verification of the translation of this report to the ICD and CPT codes and modifiers is the sole responsibility of the health care institution and practicing physician where this report was generated.  [employer] will not be held responsible for the validity of the ICD and CPT codes included on this report.  AMA assumes no liability for data contained or not contained herein. CPT is a Publishing rights managerregistered trademark of the [employer].  PATIENT NAME:  Fernando Ruiz, Fernando Ruiz MR#: [id number]

## 2014-05-21 NOTE — Progress Notes (Signed)
Fernando Ruiz ZOX:[id number] DOB: 06-Nov-1930 DOA: 05/18/2014 PCP: Tivis Ringer, MD  Brief narrative:  80 year old male with history of secondary parkinsonism, potential dementia, see daily stage II, multiple esophageal dilatations in past 4 years, psychiatric disorders, osteoporosis, hypothyroidism, prior CVA admitted with intractable nausea vomiting 11/29.  He continued to have n/v and GI consulted. Renal function declined and worsening hypernatremia prompted nephrology input  Past medical history-As per Problem list Chart reviewed as below- nad  Consultants:   Dr. Amedeo Plenty GI  Procedures:  None yet  Antibiotics:  none   Subjective   Just returned from Endo Apparently a Carrot was stuck on Endo Feels a little better A little more alert and answering more approp but is stil confusedl   Objective    Interim History:   Telemetry:    Objective: Filed Vitals:   05/21/14 1115 05/21/14 1120 05/21/14 1125 05/21/14 1245  BP:  146/78  129/74  Pulse: 80 78 82 93  Temp:    98.1 F (36.7 C)  TempSrc:    Oral  Resp: '19 19 18 18  ' Height:      Weight:      SpO2: 91% 91% 92% 96%    Intake/Output Summary (Last 24 hours) at 05/21/14 1300 Last data filed at 05/21/14 1041  Gross per 24 hour  Intake 4097.5 ml  Output    100 ml  Net 3997.5 ml    Exam:  General: eomi, ncat, Less ill appearing but very weak Cardiovascular: s1 s 2no m/r/g Respiratory: clear no added sound Abdomen: soft nt, no rebound or gaurding  Data Reviewed: Basic Metabolic Panel:  Recent Labs Lab 05/19/14 0903 05/20/14 0439 05/20/14 1530 05/20/14 2125 05/21/14 0540  NA 156* 158* 156* 150* 148*  K 4.0 4.3 4.1 3.7 3.4*  CL 118* 121* 116* 112 108  CO2 19 18* 16* 16* 19  GLUCOSE 127* 118* 125* 177* 117*  BUN 45* 51* 58* 64* 68*  CREATININE 3.16* 3.29* 3.98* 4.33* 4.64*  CALCIUM 10.2 10.3 10.4 9.8 9.4   Liver Function Tests:  Recent Labs Lab 05/18/14 1202 05/19/14 0420  05/20/14 0439 05/21/14 0540  AST 35 66* 72* 46*  ALT 19 28 58* 60*  ALKPHOS 63 64 73 74  BILITOT 0.6 0.8 1.4* 1.2  PROT 8.5* 8.1 8.0 7.4  ALBUMIN 4.4 4.1 3.9 3.3*    Recent Labs Lab 05/18/14 1202  LIPASE 31    Recent Labs Lab 05/20/14 1803  AMMONIA 36   CBC:  Recent Labs Lab 05/18/14 1202 05/19/14 0420 05/20/14 0439 05/21/14 0540  WBC 15.9* 18.6* 21.0* 15.9*  NEUTROABS 14.6*  --  19.0* 13.5*  HGB 13.9 13.3 14.3 13.4  HCT 42.5 42.2 45.2 42.0  MCV 95.7 94.8 95.6 94.0  PLT 227 244 215 195   Cardiac Enzymes:  Recent Labs Lab 05/18/14 1202  TROPONINI <0.30   BNP: Invalid input(s): POCBNP CBG: No results for input(s): GLUCAP in the last 168 hours.  No results found for this or any previous visit (from the past 240 hour(s)).   Studies:              All Imaging reviewed and is as per above notation   Scheduled Meds: . antiseptic oral rinse  7 mL Mouth Rinse BID  . divalproex  250 mg Oral Daily  . donepezil  10 mg Oral Daily  . finasteride  5 mg Oral Daily  . heparin  5,000 Units Subcutaneous 3 times per day  . levothyroxine  50 mcg Oral QAC breakfast  . mirabegron ER  25 mg Oral Daily  . ondansetron (ZOFRAN) IV  4 mg Intravenous 4 times per day  . pantoprazole  40 mg Oral Q breakfast  . piperacillin-tazobactam (ZOSYN)  IV  2.25 g Intravenous 3 times per day  . potassium chloride  20 mEq Oral Once  . trihexyphenidyl  2 mg Oral Daily   Continuous Infusions: . dextrose 200 mL/hr at 05/21/14 0523     Assessment/Plan:  1. N/v-unclear etiology-potentially globus vs stricture.  Appreciate GI input-foreign body to esophagus noted.  On clears. PPI.  Contiunue  2. Parkinsons-continue Trihexphenidyl 2 mg daily, Aricept 10 daily, Mirabegron ER 25 daily.  Consider addition Carbidopa as OP. 3. Hypernatremia-sodium decresing.  Rpt labs confirm this. Continue D5 water 50 cc/hour-->200 cc/hr onn 11/30-this is worsening-U OSM 263 suggestive either primary polydipsia  or Diabetes insipidus-h/o confirmed with wife that has been drinking ++ fluids since Wednesday when this all started.  Appreciate nephrology input in advance-?Vasopressin test?.  Strict i/o.  Rpt Bmet q 8.  Nephrology managing 4. Mild metabolic acidosis-CO2 18.  Rpt labs am.  Could be from either renal or infectious causes.  Obtain lactic acid 5. Leukocytosis-unclear etiology- CXR shows no overt PNA [rule out aspiration given h/o n/v].  starte dZosyn-likely had a chemical esophagitis vs chemical pneumonitis.  Continue Zosyn at least 7 days 6. Elevated AST-unclear etiology-rpt labs show mildly elevated transaminases, Bili has bumped upwards to 1.6 and alk phos a little higher.  Monitor LFTs and get INR in am.  US abdomen not concerning for impacted GB stones.  LFT's trendign downward slowly.  No further work up at this time 7. H/o CVA-stable 8. Bipolar- Riperidon2e 1 mg daily re-ordered as well at clonazepam 1 mg daily 9. BPH-continue Finsteride 5 mg daily 10. Htn-restarted amlodipine 5 daily.  Monitor trends.  Augment Rx if needed   PTOt eval requested Likely may require short SNF stay appreciate consultant input  Code Status: Full Family Communication:  Discussed with son at bedside in detail Disposition Plan:  Inpatient pending resolution   Verneita Griffes, MD  [facility] Pager [phone] 05/21/2014, 1:00 PM    LOS: 3 days

## 2014-05-21 NOTE — Plan of Care (Signed)
Problem: Phase III Progression Outcomes Goal: Pain controlled on oral analgesia Outcome: Not Applicable Date Met:  20/60/15  Problem: Discharge Progression Outcomes Goal: Pain controlled with appropriate interventions Outcome: Completed/Met Date Met:  05/21/14

## 2014-05-21 NOTE — Anesthesia Postprocedure Evaluation (Signed)
Anesthesia Post Note  Patient: Fernando Ruiz  Procedure(s) Performed: Procedure(s) (LRB): ESOPHAGOGASTRODUODENOSCOPY (EGD) WITH PROPOFOL with possible esophageal dilation (N/A)  Anesthesia type: MAC  Patient location: PACU  Post pain: Pain level controlled  Post assessment: Patient's Cardiovascular Status Stable  Last Vitals:  Filed Vitals:   05/21/14 1100  BP: 144/76  Pulse: 78  Temp:   Resp: 20    Post vital signs: Reviewed and stable  Level of consciousness: sedated  Complications: No apparent anesthesia complications

## 2014-05-21 NOTE — Transfer of Care (Signed)
Immediate Anesthesia Transfer of Care Note  Patient: Fernando MoatJack W Vierling  Procedure(s) Performed: Procedure(s): ESOPHAGOGASTRODUODENOSCOPY (EGD) WITH PROPOFOL with possible esophageal dilation (N/A)  Patient Location: PACU  Anesthesia Type:MAC  Level of Consciousness: awake, alert  and oriented  Airway & Oxygen Therapy: Patient Spontanous Breathing and Patient connected to nasal cannula oxygen  Post-op Assessment: Report given to PACU RN and Post -op Vital signs reviewed and stable  Post vital signs: Reviewed and stable  Complications: No apparent anesthesia complications

## 2014-05-21 NOTE — Progress Notes (Signed)
S: Feels better after endoscopic removal of food impaction in esophagus O:BP 146/78 mmHg  Pulse 82  Temp(Src) 97.9 F (36.6 C) (Axillary)  Resp 18  Ht 5\' 8"  (1.727 m)  Wt 71.215 kg (157 lb)  BMI 23.88 kg/m2  SpO2 92%  Intake/Output Summary (Last 24 hours) at 05/21/14 1220 Last data filed at 05/21/14 1041  Gross per 24 hour  Intake 4097.5 ml  Output    100 ml  Net 3997.5 ml   Weight change:  BMW:UXLKGGen:Awake and alert CVS:RRR Resp:clear Abd:+ BS NTND Ext: No edema Skin turgor better NEURO:CNI no asterixis   . antiseptic oral rinse  7 mL Mouth Rinse BID  . divalproex  250 mg Oral Daily  . donepezil  10 mg Oral Daily  . finasteride  5 mg Oral Daily  . heparin  5,000 Units Subcutaneous 3 times per day  . levothyroxine  50 mcg Oral QAC breakfast  . mirabegron ER  25 mg Oral Daily  . ondansetron (ZOFRAN) IV  4 mg Intravenous 4 times per day  . pantoprazole  40 mg Oral Q breakfast  . piperacillin-tazobactam (ZOSYN)  IV  2.25 g Intravenous 3 times per day  . trihexyphenidyl  2 mg Oral Daily   Dg Chest 2 View  05/19/2014   CLINICAL DATA:  Pneumonia  EXAM: CHEST  2 VIEW  COMPARISON:  01/31/2013  FINDINGS: Cardiomediastinal silhouette is stable. No acute infiltrate or pleural effusion. No pulmonary edema. Osteopenia and mild degenerative changes mid thoracic spine. Atherosclerotic calcifications of thoracic aorta.  IMPRESSION: No active cardiopulmonary disease.   Electronically Signed   By: Natasha MeadLiviu  Pop M.D.   On: 05/19/2014 16:44   [place] Abdomen Complete  05/21/2014   CLINICAL DATA:  History of gallstones ; dialysis dependent chronic renal failure  EXAM: ULTRASOUND ABDOMEN COMPLETE  COMPARISON:  CT scan of the abdomen and pelvis of May 18 2014  FINDINGS: Gallbladder: The gallbladder is adequately distended. There is a 11 mm diameter mobile mobile echogenic shadowing stone. There is no gallbladder wall thickening, pericholecystic fluid, or positive sonographic Murphy's sign.  Common bile  duct: Diameter: 4.3 mm  Liver: No focal lesion identified. Within normal limits in parenchymal echogenicity.  IVC: No abnormality visualized.  Pancreas: Visualized portion unremarkable.  Spleen: Size and appearance within normal limits.  Right Kidney: Length: 10.5 cm. There is cortical thinning diffusely with increased cortical echotexture.  Left Kidney: Length: 10 cm. There is cortical thinning diffusely and increased cortical echotexture. There is a 1.9 cm simple appearing upper pole cyst.  Abdominal aorta: No aneurysm visualized.  Other findings: None.  IMPRESSION: 1. Cholelithiasis without evidence of acute cholecystitis. 2. Changes within the kidneys consistent with chronic renal failure.   Electronically Signed   By: David  [place]azil[place]   On: 05/21/2014 08:13   Dg Esophagus  05/20/2014   CLINICAL DATA:  Vomiting.  Dysphagia.  ICD10: R 13.1.  Dementia.  EXAM: ESOPHAGUS/BARIUM SWALLOW/TABLET STUDY  COMPARISON:  Chest radiograph of 05/19/2014. Abdominal pelvic CT of 05/18/2014.  FINDINGS: Exam could not be performed. Patient was somnolent, and only mildly arousable. Attempt was made to have the patient swallow contrast, without success.  IMPRESSION: Unsuccessful attempt at esophagram. Given the clinical history of dementia and dysphasia, recommend speech pathology evaluation prior to attempting repeat esophagram. Esophagram should also be withheld until the patient can follow directions.   Electronically Signed   By: Jeronimo GreavesKyle  Talbot M.D.   On: 05/20/2014 16:05   BMET    Component Value  Date/Time   NA 148* 05/21/2014 0540   K 3.4* 05/21/2014 0540   CL 108 05/21/2014 0540   CO2 19 05/21/2014 0540   GLUCOSE 117* 05/21/2014 0540   BUN 68* 05/21/2014 0540   CREATININE 4.64* 05/21/2014 0540   CALCIUM 9.4 05/21/2014 0540   GFRNONAA 11* 05/21/2014 0540   GFRAA 12* 05/21/2014 0540   CBC    Component Value Date/Time   WBC 15.9* 05/21/2014 0540   RBC 4.47 05/21/2014 0540   HGB 13.4 05/21/2014 0540   HCT  42.0 05/21/2014 0540   PLT 195 05/21/2014 0540   MCV 94.0 05/21/2014 0540   MCH 30.0 05/21/2014 0540   MCHC 31.9 05/21/2014 0540   RDW 14.2 05/21/2014 0540   LYMPHSABS 1.2 05/21/2014 0540   MONOABS 1.0 05/21/2014 0540   EOSABS 0.2 05/21/2014 0540   BASOSABS 0.0 05/21/2014 0540     Assessment: 1. N/V sec to esophageal food impaction 2. Hypernatremia, improving 3. Acute on CKD 3 most likely  sec ATN, non oliguric 4. hypokalemia  Plan: 1. Decrease IV fluids.  Not sure how accurate I/O's were yest as only 100cc recorded in 2nd shift and there is a fair amt of urine in foley bag now. 2. Daily renal profile.  Hopefully renal fx will improve before any decision regarding HD has to be made 3. Replace K  Chenita Ruda T

## 2014-05-21 NOTE — Anesthesia Preprocedure Evaluation (Signed)
Anesthesia Evaluation  Patient identified by MRN, date of birth, ID band Patient awake    Airway Mallampati: III  TM Distance: >3 FB Neck ROM: Full    Dental  (+) Edentulous Upper, Edentulous Lower, Dental Advisory Given   Pulmonary          Cardiovascular Rhythm:Regular Rate:Normal     Neuro/Psych PSYCHIATRIC DISORDERS Bipolar Disorder Cerebral artery infarct CVA    GI/Hepatic   Endo/Other  Hypothyroidism   Renal/GU Renal disease     Musculoskeletal   Abdominal   Peds  Hematology   Anesthesia Other Findings   Reproductive/Obstetrics                             Anesthesia Physical Anesthesia Plan  ASA: III  Anesthesia Plan: MAC   Post-op Pain Management:    Induction: Intravenous  Airway Management Planned: Nasal Cannula  Additional Equipment:   Intra-op Plan:   Post-operative Plan:   Informed Consent: I have reviewed the patients History and Physical, chart, labs and discussed the procedure including the risks, benefits and alternatives for the proposed anesthesia with the patient or authorized representative who has indicated his/her understanding and acceptance.   Dental advisory given  Plan Discussed with: CRNA, Anesthesiologist and Surgeon  Anesthesia Plan Comments:         Anesthesia Quick Evaluation

## 2014-05-22 ENCOUNTER — Inpatient Hospital Stay (HOSPITAL_COMMUNITY): Payer: Medicare Other

## 2014-05-22 ENCOUNTER — Encounter (HOSPITAL_COMMUNITY): Payer: Self-pay | Admitting: Gastroenterology

## 2014-05-22 DIAGNOSIS — K222 Esophageal obstruction: Secondary | ICD-10-CM

## 2014-05-22 DIAGNOSIS — D72829 Elevated white blood cell count, unspecified: Secondary | ICD-10-CM | POA: Insufficient documentation

## 2014-05-22 DIAGNOSIS — W44F3XA Food entering into or through a natural orifice, initial encounter: Secondary | ICD-10-CM

## 2014-05-22 DIAGNOSIS — K2210 Ulcer of esophagus without bleeding: Secondary | ICD-10-CM | POA: Clinically undetermined

## 2014-05-22 DIAGNOSIS — K269 Duodenal ulcer, unspecified as acute or chronic, without hemorrhage or perforation: Secondary | ICD-10-CM | POA: Diagnosis present

## 2014-05-22 DIAGNOSIS — E039 Hypothyroidism, unspecified: Secondary | ICD-10-CM

## 2014-05-22 DIAGNOSIS — T18128A Food in esophagus causing other injury, initial encounter: Principal | ICD-10-CM

## 2014-05-22 LAB — CBC WITH DIFFERENTIAL/PLATELET
BASOS PCT: 0 % (ref 0–1)
Basophils Absolute: 0 10*3/uL (ref 0.0–0.1)
EOS PCT: 1 % (ref 0–5)
Eosinophils Absolute: 0.1 10*3/uL (ref 0.0–0.7)
HEMATOCRIT: 39.7 % (ref 39.0–52.0)
HEMOGLOBIN: 12.7 g/dL — AB (ref 13.0–17.0)
LYMPHS PCT: 7 % — AB (ref 12–46)
Lymphs Abs: 0.9 10*3/uL (ref 0.7–4.0)
MCH: 29.7 pg (ref 26.0–34.0)
MCHC: 32 g/dL (ref 30.0–36.0)
MCV: 92.8 fL (ref 78.0–100.0)
MONO ABS: 0.8 10*3/uL (ref 0.1–1.0)
Monocytes Relative: 6 % (ref 3–12)
NEUTROS ABS: 11.1 10*3/uL — AB (ref 1.7–7.7)
Neutrophils Relative %: 86 % — ABNORMAL HIGH (ref 43–77)
Platelets: 183 10*3/uL (ref 150–400)
RBC: 4.28 MIL/uL (ref 4.22–5.81)
RDW: 13.8 % (ref 11.5–15.5)
WBC: 13 10*3/uL — ABNORMAL HIGH (ref 4.0–10.5)

## 2014-05-22 LAB — URINE MICROSCOPIC-ADD ON

## 2014-05-22 LAB — URINALYSIS, ROUTINE W REFLEX MICROSCOPIC
Bilirubin Urine: NEGATIVE
GLUCOSE, UA: NEGATIVE mg/dL
Ketones, ur: NEGATIVE mg/dL
Nitrite: NEGATIVE
PH: 5 (ref 5.0–8.0)
Protein, ur: 30 mg/dL — AB
Specific Gravity, Urine: 1.01 (ref 1.005–1.030)
Urobilinogen, UA: 0.2 mg/dL (ref 0.0–1.0)

## 2014-05-22 LAB — COMPREHENSIVE METABOLIC PANEL
ALK PHOS: 102 U/L (ref 39–117)
ALT: 58 U/L — ABNORMAL HIGH (ref 0–53)
ANION GAP: 21 — AB (ref 5–15)
AST: 44 U/L — AB (ref 0–37)
Albumin: 2.8 g/dL — ABNORMAL LOW (ref 3.5–5.2)
BILIRUBIN TOTAL: 0.7 mg/dL (ref 0.3–1.2)
BUN: 67 mg/dL — ABNORMAL HIGH (ref 6–23)
CHLORIDE: 107 meq/L (ref 96–112)
CO2: 17 mEq/L — ABNORMAL LOW (ref 19–32)
Calcium: 9.2 mg/dL (ref 8.4–10.5)
Creatinine, Ser: 4.78 mg/dL — ABNORMAL HIGH (ref 0.50–1.35)
GFR calc Af Amer: 12 mL/min — ABNORMAL LOW (ref >90)
GFR calc non Af Amer: 10 mL/min — ABNORMAL LOW (ref >90)
Glucose, Bld: 131 mg/dL — ABNORMAL HIGH (ref 70–99)
POTASSIUM: 3.9 meq/L (ref 3.7–5.3)
SODIUM: 145 meq/L (ref 137–147)
Total Protein: 6.9 g/dL (ref 6.0–8.3)

## 2014-05-22 LAB — MAGNESIUM: Magnesium: 2.4 mg/dL (ref 1.5–2.5)

## 2014-05-22 LAB — PHOSPHORUS: Phosphorus: 5.6 mg/dL — ABNORMAL HIGH (ref 2.3–4.6)

## 2014-05-22 NOTE — Clinical Social Work Psychosocial (Signed)
Eulas PostJoshua P Landau, MD Physician Signed Orthopedics Progress Notes 05/22/2014 8:48 AM    Expand All Collapse All       Subjective:  Patient reports pain as severe. Doesn't want to move.  Objective:   VITALS:  Filed Vitals:   05/22/14 0100 05/22/14 0203 05/22/14 0302 05/22/14 0401  BP: 167/89 171/87 171/86 165/87  Pulse: 74 71 73 74  Temp: 98.5 F (36.9 C)     TempSrc: Oral     Resp: 12 15 18 17   Height:      Weight:      SpO2: 97% 97% 96% 97%   LUE: Neurologically intact Intact pulses distally Dorsiflexion/Plantar flexion intact Drain in place. All fingers f/e/abduct, and shoulder sensation intact.   Recent Labs    Lab Results  Component Value Date   WBC 14.0* 05/22/2014   HGB 8.8* 05/22/2014   HCT 27.8* 05/22/2014   MCV 85.3 05/22/2014   PLT 437* 05/22/2014     BMET  Labs (Brief)       Component Value Date/Time   NA 134* 05/22/2014 0047   NA 141 01/29/2014   K 3.7 05/22/2014 0047   CL 95* 05/22/2014 0047   CO2 25 05/22/2014 0047   GLUCOSE 76 05/22/2014 0047   BUN 16 05/22/2014 0047   BUN 16 01/29/2014   CREATININE 0.69 05/22/2014 0047   CREATININE 1.02 11/01/2013 1118   CALCIUM 8.4 05/22/2014 0047   GFRNONAA >90 05/22/2014 0047   GFRNONAA 60 11/01/2013 1118   GFRAA >90 05/22/2014 0047   GFRAA 69 11/01/2013 1118       Assessment/Plan: 1 Day Post-Op   Principal Problem:  right periprosthetic femur fracture, greater trochanter, minimal displacement Active Problems:  COPD with emphysema  Hypothyroidism  Adrenal insufficiency  HIV (human immunodeficiency virus infection)  Hep C w/o coma, chronic  AIDS wasting syndrome  Hypertension  Chronic pain syndrome  Tobacco abuse  Fall at home  Hypokalemia  Peripheral neuropathy  History of cocaine abuse  Osteoporosis  Protein-calorie malnutrition,  severe  Fracture of right humerus  Iron deficiency anemia  Septic arthritis of shoulder, left   Advance diet Keep drain one more day. picc line, ID consult.    LANDAU,JOSHUA P 05/22/2014, 8:48 AM   Teryl LucyJoshua Landau, MD Cell [phone]                Reece LevyJanet Stevon Gough, MSW, Theresia MajorsLCSWA [phone]

## 2014-05-22 NOTE — Evaluation (Signed)
Occupational Therapy Evaluation Patient Details Name: Fernando Ruiz MRN: [id number] DOB: 10/05/1930 Today's Date: 05/22/2014    History of Present Illness 80 year old male with history of secondary parkinsonism, potential dementia, see daily stage II, multiple esophageal dilatations in past 4 years, psychiatric disorders, osteoporosis, hypothyroidism, prior CVA admitted with intractable nausea vomiting 11/29.    Clinical Impression   Pt admitted with N/V. Pt currently with functional limitations due to the deficits listed below (see OT Problem List).  Pt will benefit from skilled OT to increase their safety and independence with ADL and functional mobility for ADL to facilitate discharge to venue listed below.       Follow Up Recommendations  SNF    Equipment Recommendations  None recommended by OT    Recommendations for Other Services       Precautions / Restrictions Precautions Precautions: Fall Restrictions Weight Bearing Restrictions: No      Mobility Bed Mobility Overal bed mobility: Needs Assistance Bed Mobility: Supine to Sit     Supine to sit: Min assist;HOB elevated     General bed mobility comments: A with bed pad  Transfers Overall transfer level: Needs assistance Equipment used: 1 person hand held assist Transfers: Sit to/from Stand Sit to Stand: Mod assist         General transfer comment: pt did not want to get to chair         ADL Overall ADL's : Needs assistance/impaired Eating/Feeding: Moderate assistance;Sitting   Grooming: Minimal assistance;Sitting   Upper Body Bathing: Moderate assistance;Sitting   Lower Body Bathing: Maximal assistance;Sit to/from stand   Upper Body Dressing : Moderate assistance;Sitting   Lower Body Dressing: Maximal assistance;Sit to/from stand                                 Pertinent Vitals/Pain Pain Assessment: No/denies pain     Hand Dominance     Extremity/Trunk Assessment Upper  Extremity Assessment Upper Extremity Assessment: Generalized weakness           Communication Communication Communication: No difficulties   Cognition Arousal/Alertness: Awake/alert Behavior During Therapy: WFL for tasks assessed/performed Overall Cognitive Status: History of cognitive impairments - at baseline                     General Comments   pt agreeable and wife supportive            Home Living Family/patient expects to be discharged to:: Skilled nursing facility Living Arrangements: Children (son) Available Help at Discharge: Family;Available PRN/intermittently Type of Home: House Home Access: Level entry     Home Layout: One level               Home Equipment: None          Prior Functioning/Environment Level of Independence: Needs assistance  Gait / Transfers Assistance Needed: States he ambulated without an assistive device ADL's / Homemaking Assistance Needed: States his son would assist some with ADLs        OT Diagnosis: Generalized weakness   OT Problem List: Decreased strength;Decreased activity tolerance;Decreased safety awareness   OT Treatment/Interventions: Self-care/ADL training;DME and/or AE instruction;Patient/family education    OT Goals(Current goals can be found in the care plan section) Acute Rehab OT Goals Patient Stated Goal: Go home OT Goal Formulation: With patient Time For Goal Achievement: 05/22/14  OT Frequency: Min 2X/week   Barriers to Ruiz/C: Decreased caregiver support  End of Session Nurse Communication: Mobility status  Activity Tolerance: Patient tolerated treatment well Patient left: in bed;with call bell/phone within reach;with family/visitor present   Time: [id number] OT Time Calculation (min): 18 min Charges:  OT General Charges $OT Visit: 1 Procedure OT Evaluation $Initial OT Evaluation Tier I: 1 Procedure OT Treatments $Self Care/Home Management : 8-22 mins G-Codes:     Einar CrowEDDING, Fernando Ruiz 05/22/2014, 1:58 PM

## 2014-05-22 NOTE — Progress Notes (Signed)
[facility] Progress Note  Subjective: Swallowing much better since esophagus cleared.  Objective: Vital signs in last 24 hours: Temp:  [97.8 F (36.6 C)-98.5 F (36.9 C)] 98.5 F (36.9 C) (12/02 0452) Pulse Rate:  [75-93] 75 (12/02 0452) Resp:  [18-22] 22 (12/02 0452) BP: (107-129)/(59-74) 116/73 mmHg (12/02 0452) SpO2:  [92 %-96 %] 96 % (12/02 0452) Weight change:    PE: Abdomen soft and not tender.  Ultrasound showed gallstones. No sign of cholecystitis  Lab Results: Results for orders placed or performed during the hospital encounter of 05/18/14 (from the past 24 hour(s))  Comprehensive metabolic panel     Status: Abnormal   Collection Time: 05/22/14  6:02 AM  Result Value Ref Range   Sodium 145 137 - 147 mEq/L   Potassium 3.9 3.7 - 5.3 mEq/L   Chloride 107 96 - 112 mEq/L   CO2 17 (L) 19 - 32 mEq/L   Glucose, Bld 131 (H) 70 - 99 mg/dL   BUN 67 (H) 6 - 23 mg/dL   Creatinine, Ser 0.984.78 (H) 0.50 - 1.35 mg/dL   Calcium 9.2 8.4 - 11.910.5 mg/dL   Total Protein 6.9 6.0 - 8.3 g/dL   Albumin 2.8 (L) 3.5 - 5.2 g/dL   AST 44 (H) 0 - 37 U/L   ALT 58 (H) 0 - 53 U/L   Alkaline Phosphatase 102 39 - 117 U/L   Total Bilirubin 0.7 0.3 - 1.2 mg/dL   GFR calc non Af Amer 10 (L) >90 mL/min   GFR calc Af Amer 12 (L) >90 mL/min   Anion gap 21 (H) 5 - 15  CBC with Differential     Status: Abnormal   Collection Time: 05/22/14  6:02 AM  Result Value Ref Range   WBC 13.0 (H) 4.0 - 10.5 K/uL   RBC 4.28 4.22 - 5.81 MIL/uL   Hemoglobin 12.7 (L) 13.0 - 17.0 g/dL   HCT 14.739.7 82.939.0 - 56.252.0 %   MCV 92.8 78.0 - 100.0 fL   MCH 29.7 26.0 - 34.0 pg   MCHC 32.0 30.0 - 36.0 g/dL   RDW 13.013.8 86.511.5 - 78.415.5 %   Platelets 183 150 - 400 K/uL   Neutrophils Relative % 86 (H) 43 - 77 %   Neutro Abs 11.1 (H) 1.7 - 7.7 K/uL   Lymphocytes Relative 7 (L) 12 - 46 %   Lymphs Abs 0.9 0.7 - 4.0 K/uL   Monocytes Relative 6 3 - 12 %   Monocytes Absolute 0.8 0.1 - 1.0 K/uL   Eosinophils Relative 1 0 - 5 %    Eosinophils Absolute 0.1 0.0 - 0.7 K/uL   Basophils Relative 0 0 - 1 %   Basophils Absolute 0.0 0.0 - 0.1 K/uL  Magnesium     Status: None   Collection Time: 05/22/14  6:02 AM  Result Value Ref Range   Magnesium 2.4 1.5 - 2.5 mg/dL  Phosphorus     Status: Abnormal   Collection Time: 05/22/14  6:02 AM  Result Value Ref Range   Phosphorus 5.6 (H) 2.3 - 4.6 mg/dL    Studies/Results: [place] Abdomen Complete  05/21/2014   CLINICAL DATA:  History of gallstones ; dialysis dependent chronic renal failure  EXAM: ULTRASOUND ABDOMEN COMPLETE  COMPARISON:  CT scan of the abdomen and pelvis of May 18 2014  FINDINGS: Gallbladder: The gallbladder is adequately distended. There is a 11 mm diameter mobile mobile echogenic shadowing stone. There is no gallbladder wall thickening, pericholecystic fluid, or  positive sonographic Murphy's sign.  Common bile duct: Diameter: 4.3 mm  Liver: No focal lesion identified. Within normal limits in parenchymal echogenicity.  IVC: No abnormality visualized.  Pancreas: Visualized portion unremarkable.  Spleen: Size and appearance within normal limits.  Right Kidney: Length: 10.5 cm. There is cortical thinning diffusely with increased cortical echotexture.  Left Kidney: Length: 10 cm. There is cortical thinning diffusely and increased cortical echotexture. There is a 1.9 cm simple appearing upper pole cyst.  Abdominal aorta: No aneurysm visualized.  Other findings: None.  IMPRESSION: 1. Cholelithiasis without evidence of acute cholecystitis. 2. Changes within the kidneys consistent with chronic renal failure.   Electronically Signed   By: David  [place]   On: 05/21/2014 08:13      Assessment: Food Impaction cleared from esophagus Assymptomatic gallstones  Plan: Continue supportive care. Not much more to add from a GI standpoint at this time. Will sign off. Call us if needed.    Teresa Lemmerman F 05/22/2014, 11:24 AM

## 2014-05-22 NOTE — Progress Notes (Signed)
S: Able to keep water and apple sauce down O:BP 116/73 mmHg  Pulse 75  Temp(Src) 98.5 F (36.9 C) (Oral)  Resp 22  Ht 5\' 8"  (1.727 m)  Wt 71.215 kg (157 lb)  BMI 23.88 kg/m2  SpO2 96%  Intake/Output Summary (Last 24 hours) at 05/22/14 1030 Last data filed at 05/22/14 0455  Gross per 24 hour  Intake 3572.5 ml  Output   1600 ml  Net 1972.5 ml   Weight change:  ZOX:WRUEAGen:Awake and alert CVS:RRR Resp:clear Abd:+ BS NTND Ext: No edema NEURO:CNI no asterixis   . antiseptic oral rinse  7 mL Mouth Rinse BID  . divalproex  250 mg Oral Daily  . donepezil  10 mg Oral Daily  . finasteride  5 mg Oral Daily  . heparin  5,000 Units Subcutaneous 3 times per day  . levothyroxine  50 mcg Oral QAC breakfast  . mirabegron ER  25 mg Oral Daily  . ondansetron (ZOFRAN) IV  4 mg Intravenous 4 times per day  . pantoprazole  40 mg Oral Q breakfast  . piperacillin-tazobactam (ZOSYN)  IV  2.25 g Intravenous 3 times per day  . trihexyphenidyl  2 mg Oral Daily   [place] Abdomen Complete  05/21/2014   CLINICAL DATA:  History of gallstones ; dialysis dependent chronic renal failure  EXAM: ULTRASOUND ABDOMEN COMPLETE  COMPARISON:  CT scan of the abdomen and pelvis of May 18 2014  FINDINGS: Gallbladder: The gallbladder is adequately distended. There is a 11 mm diameter mobile mobile echogenic shadowing stone. There is no gallbladder wall thickening, pericholecystic fluid, or positive sonographic Murphy's sign.  Common bile duct: Diameter: 4.3 mm  Liver: No focal lesion identified. Within normal limits in parenchymal echogenicity.  IVC: No abnormality visualized.  Pancreas: Visualized portion unremarkable.  Spleen: Size and appearance within normal limits.  Right Kidney: Length: 10.5 cm. There is cortical thinning diffusely with increased cortical echotexture.  Left Kidney: Length: 10 cm. There is cortical thinning diffusely and increased cortical echotexture. There is a 1.9 cm simple appearing upper pole cyst.   Abdominal aorta: No aneurysm visualized.  Other findings: None.  IMPRESSION: 1. Cholelithiasis without evidence of acute cholecystitis. 2. Changes within the kidneys consistent with chronic renal failure.   Electronically Signed   By: David  [place]   On: 05/21/2014 08:13   Dg Esophagus  05/20/2014   CLINICAL DATA:  Vomiting.  Dysphagia.  ICD10: R 13.1.  Dementia.  EXAM: ESOPHAGUS/BARIUM SWALLOW/TABLET STUDY  COMPARISON:  Chest radiograph of 05/19/2014. Abdominal pelvic CT of 05/18/2014.  FINDINGS: Exam could not be performed. Patient was somnolent, and only mildly arousable. Attempt was made to have the patient swallow contrast, without success.  IMPRESSION: Unsuccessful attempt at esophagram. Given the clinical history of dementia and dysphasia, recommend speech pathology evaluation prior to attempting repeat esophagram. Esophagram should also be withheld until the patient can follow directions.   Electronically Signed   By: Jeronimo GreavesKyle  Talbot M.D.   On: 05/20/2014 16:05   Dg Chest Port 1 View  05/22/2014   CLINICAL DATA:  Leukocytosis.  Weakness.  EXAM: PORTABLE CHEST - 1 VIEW  COMPARISON:  05/19/2014.  01/31/2013.  FINDINGS: Low volume chest. Probable superimposed interstitial pulmonary edema. Basilar atelectasis. Tortuous or ectatic thoracic aortic arch with atherosclerosis. Cardiopericardial silhouette is probably unchanged allowing for lower lung volumes.  No gross focal consolidation. Probable calcified lymph node in the RIGHT hilum.  IMPRESSION: Low volume chest with probable interstitial pulmonary edema.   Electronically Signed  By: Andreas NewportGeoffrey  Lamke M.D.   On: 05/22/2014 08:53   BMET    Component Value Date/Time   NA 145 05/22/2014 0602   K 3.9 05/22/2014 0602   CL 107 05/22/2014 0602   CO2 17* 05/22/2014 0602   GLUCOSE 131* 05/22/2014 0602   BUN 67* 05/22/2014 0602   CREATININE 4.78* 05/22/2014 0602   CALCIUM 9.2 05/22/2014 0602   GFRNONAA 10* 05/22/2014 0602   GFRAA 12* 05/22/2014 0602    CBC    Component Value Date/Time   WBC 13.0* 05/22/2014 0602   RBC 4.28 05/22/2014 0602   HGB 12.7* 05/22/2014 0602   HCT 39.7 05/22/2014 0602   PLT 183 05/22/2014 0602   MCV 92.8 05/22/2014 0602   MCH 29.7 05/22/2014 0602   MCHC 32.0 05/22/2014 0602   RDW 13.8 05/22/2014 0602   LYMPHSABS 0.9 05/22/2014 0602   MONOABS 0.8 05/22/2014 0602   EOSABS 0.1 05/22/2014 0602   BASOSABS 0.0 05/22/2014 0602     Assessment: 1. N/V sec to esophageal food impaction 2. Hypernatremia, resolved 3. Acute on CKD 3 most likely  sec ATN, non oliguric.  Rate of rise of SCr less so hopefully renal fx will start to improve soon  Plan: 1. Push PO fluids.  Will see if he can keep up with UO 2. Daily labs  Koni Kannan T

## 2014-05-22 NOTE — Progress Notes (Signed)
[facility] PROGRESS NOTE  Donnal MoatJack W Kehm ZOX:[id number] DOB: 11/02/1930 DOA: 05/18/2014 PCP: Hoyle SauerAVVA,[name], MD  Assessment/Plan: #1 acute on chronic kidney disease stage III Likely secondary to volume depletion plus or minus mild ATN. Patient with good urine output. Patient is currently nothing by mouth. Push by mouth fluids. Renal following and appreciate input and recommendations.  #2 hyponatremia Likely secondary to volume depletion. Resolved. Normal saline lock IV fluids.  #3 nausea and vomiting secondary to esophageal food impaction Status post disimpaction. EGD. Patient swallowing better. Patient with no further nausea or vomiting. Continue supportive care. Advance diet as tolerated. Follow.  #4 asymptomatic gallstones.  #5 leukocytosis Likely reactive leukocytosis. Repeat chest x-ray negative for any acute infiltrate. Will check a UA with cultures and sensitivities. Patient has been started on IV Zosyn. Follow.  #6 bipolar disorder Continue risperdal and clonazepam.  #7 BPH Continue Proscar.  #8 hypertension Continue Proscar.  #9 transaminitis Abdominal ultrasound with gallstones. LFTs trending down. No further workup at this time.  #10 history of CVA Stable.  #11 hypothyroidism Continue Synthroid.  #12 prophylaxis Heparin for DVT prophylaxis. PPI for GI prophylaxis.  Code Status: DO NOT RESUSCITATE Family Communication: Updated patient and family at bedside. Disposition Plan: Skilled nursing facility when medically stable.   Consultants:  Gastroenterology: Dr. Madilyn FiremanHayes 05/19/2014  Nephrology: Dr. Briant CedarMattingly 05/20/2014  Procedures:  EGD 05/21/2014 per Dr. Evette CristalGanem  Chest x-ray 05/19/2014, 05/22/2014  Abdominal ultrasound 05/21/2014  Antibiotics:  IV Zosyn 05/20/2014  HPI/Subjective: Patient denies any nausea or vomiting. Patient states swallowing has improved.  Objective: Filed Vitals:   05/22/14 0452  BP: 116/73  Pulse: 75  Temp: 98.5  F (36.9 C)  Resp: 22    Intake/Output Summary (Last 24 hours) at 05/22/14 1629 Last data filed at 05/22/14 1031  Gross per 24 hour  Intake 3072.5 ml  Output    800 ml  Net 2272.5 ml   Filed Weights   05/18/14 1119  Weight: 71.215 kg (157 lb)    Exam:   General:  nad  Cardiovascular: rrr  Respiratory: ctab anterior lung fields  Abdomen:  Soft, nontender, nondistended, positive bowel sounds.  Musculoskeletal: No clubbing cyanosis or edema.  Data Reviewed: Basic Metabolic Panel:  Recent Labs Lab 05/20/14 0439 05/20/14 1530 05/20/14 2125 05/21/14 0540 05/22/14 0602  NA 158* 156* 150* 148* 145  K 4.3 4.1 3.7 3.4* 3.9  CL 121* 116* 112 108 107  CO2 18* 16* 16* 19 17*  GLUCOSE 118* 125* 177* 117* 131*  BUN 51* 58* 64* 68* 67*  CREATININE 3.29* 3.98* 4.33* 4.64* 4.78*  CALCIUM 10.3 10.4 9.8 9.4 9.2  MG  --   --   --   --  2.4  PHOS  --   --   --   --  5.6*   Liver Function Tests:  Recent Labs Lab 05/18/14 1202 05/19/14 0420 05/20/14 0439 05/21/14 0540 05/22/14 0602  AST 35 66* 72* 46* 44*  ALT 19 28 58* 60* 58*  ALKPHOS 63 64 73 74 102  BILITOT 0.6 0.8 1.4* 1.2 0.7  PROT 8.5* 8.1 8.0 7.4 6.9  ALBUMIN 4.4 4.1 3.9 3.3* 2.8*    Recent Labs Lab 05/18/14 1202  LIPASE 31    Recent Labs Lab 05/20/14 1803  AMMONIA 36   CBC:  Recent Labs Lab 05/18/14 1202 05/19/14 0420 05/20/14 0439 05/21/14 0540 05/22/14 0602  WBC 15.9* 18.6* 21.0* 15.9* 13.0*  NEUTROABS 14.6*  --  19.0* 13.5* 11.1*  HGB 13.9  13.3 14.3 13.4 12.7*  HCT 42.5 42.2 45.2 42.0 39.7  MCV 95.7 94.8 95.6 94.0 92.8  PLT 227 244 215 195 183   Cardiac Enzymes:  Recent Labs Lab 05/18/14 1202  TROPONINI <0.30   BNP (last 3 results) No results for input(s): PROBNP in the last 8760 hours. CBG: No results for input(s): GLUCAP in the last 168 hours.  No results found for this or any previous visit (from the past 240 hour(s)).   Studies: [place] Abdomen Complete  05/21/2014    CLINICAL DATA:  History of gallstones ; dialysis dependent chronic renal failure  EXAM: ULTRASOUND ABDOMEN COMPLETE  COMPARISON:  CT scan of the abdomen and pelvis of May 18 2014  FINDINGS: Gallbladder: The gallbladder is adequately distended. There is a 11 mm diameter mobile mobile echogenic shadowing stone. There is no gallbladder wall thickening, pericholecystic fluid, or positive sonographic Murphy's sign.  Common bile duct: Diameter: 4.3 mm  Liver: No focal lesion identified. Within normal limits in parenchymal echogenicity.  IVC: No abnormality visualized.  Pancreas: Visualized portion unremarkable.  Spleen: Size and appearance within normal limits.  Right Kidney: Length: 10.5 cm. There is cortical thinning diffusely with increased cortical echotexture.  Left Kidney: Length: 10 cm. There is cortical thinning diffusely and increased cortical echotexture. There is a 1.9 cm simple appearing upper pole cyst.  Abdominal aorta: No aneurysm visualized.  Other findings: None.  IMPRESSION: 1. Cholelithiasis without evidence of acute cholecystitis. 2. Changes within the kidneys consistent with chronic renal failure.   Electronically Signed   By: David  SwazilandJordan   On: 05/21/2014 08:13   Dg Chest Port 1 View  05/22/2014   CLINICAL DATA:  Leukocytosis.  Weakness.  EXAM: PORTABLE CHEST - 1 VIEW  COMPARISON:  05/19/2014.  01/31/2013.  FINDINGS: Low volume chest. Probable superimposed interstitial pulmonary edema. Basilar atelectasis. Tortuous or ectatic thoracic aortic arch with atherosclerosis. Cardiopericardial silhouette is probably unchanged allowing for lower lung volumes.  No gross focal consolidation. Probable calcified lymph node in the RIGHT hilum.  IMPRESSION: Low volume chest with probable interstitial pulmonary edema.   Electronically Signed   By: Andreas NewportGeoffrey  Lamke M.D.   On: 05/22/2014 08:53    Scheduled Meds: . antiseptic oral rinse  7 mL Mouth Rinse BID  . divalproex  250 mg Oral Daily  . donepezil   10 mg Oral Daily  . finasteride  5 mg Oral Daily  . heparin  5,000 Units Subcutaneous 3 times per day  . levothyroxine  50 mcg Oral QAC breakfast  . mirabegron ER  25 mg Oral Daily  . ondansetron (ZOFRAN) IV  4 mg Intravenous 4 times per day  . pantoprazole  40 mg Oral Q breakfast  . piperacillin-tazobactam (ZOSYN)  IV  2.25 g Intravenous 3 times per day  . trihexyphenidyl  2 mg Oral Daily   Continuous Infusions:   Principal Problem:   AKI (acute kidney injury) Active Problems:   Cerebral artery occlusion with cerebral infarction   Secondary parkinsonism   High cholesterol   Bipolar disorder   Hypothyroidism   Dementia   Uncontrollable nausea and vomiting   Pyelonephritis   Nausea & vomiting   Esophageal obstruction due to food impaction   Duodenal ulcer: per EGD 21/1/15   Ulcerative esophagitis: where food impaction was per EGD 05/21/14    Time spent: 40 mins    [facility]OMPSON,Astraea Gaughran MD [facility] Pager [phone]. If 7PM-7AM, please contact night-coverage at [email or web address], password [email or web address] 05/22/2014, 4:29 PM  LOS: 4  days

## 2014-05-22 NOTE — Clinical Social Work Placement (Signed)
Clinical Social Work Department CLINICAL SOCIAL WORK PLACEMENT NOTE 05/22/2014  Patient:  Fernando Ruiz,Fernando Ruiz  Account Number:  [id number] Admit date:  05/18/2014  Clinical Social Worker:  Robin SearingJANET Leaf [employer], LCSWA  Date/time:  05/22/2014 12:44 PM  Clinical Social Work is seeking post-discharge placement for this patient at the following level of care:   SKILLED NURSING   (*CSW will update this form in Epic as items are completed)   05/22/2014  Patient/family provided with [facility] of Clinical Social Work's list of facilities offering this level of care within the geographic area requested by the patient (or if unable, by the patient's family).  05/22/2014  Patient/family informed of their freedom to choose among providers that offer the needed level of care, that participate in Medicare, Medicaid or managed care program needed by the patient, have an available bed and are willing to accept the patient.  05/22/2014  Patient/family informed of [facility]' ownership interest in [facility], as well as of the fact that they are under no obligation to receive care at this facility.  PASARR submitted to EDS on 05/22/2014 PASARR number received on   FL2 transmitted to all facilities in geographic area requested by pt/family on  05/22/2014 FL2 transmitted to all facilities within larger geographic area on   Patient informed that his/her managed care company has contracts with or will negotiate with  certain facilities, including the following:     Patient/family informed of bed offers received:   Patient chooses bed at  Physician recommends and patient chooses bed at    Patient to be transferred to  on   Patient to be transferred to facility by  Patient and family notified of transfer on  Name of family member notified:    The following physician request were entered in Epic:   Additional Comments: Reece LevyJanet  [place], MSW, [employer]CSWA [phone]

## 2014-05-23 DIAGNOSIS — N3000 Acute cystitis without hematuria: Secondary | ICD-10-CM

## 2014-05-23 DIAGNOSIS — E870 Hyperosmolality and hypernatremia: Secondary | ICD-10-CM | POA: Insufficient documentation

## 2014-05-23 DIAGNOSIS — N390 Urinary tract infection, site not specified: Secondary | ICD-10-CM | POA: Clinically undetermined

## 2014-05-23 LAB — RENAL FUNCTION PANEL
ANION GAP: 20 — AB (ref 5–15)
Albumin: 2.6 g/dL — ABNORMAL LOW (ref 3.5–5.2)
BUN: 69 mg/dL — ABNORMAL HIGH (ref 6–23)
CHLORIDE: 114 meq/L — AB (ref 96–112)
CO2: 16 mEq/L — ABNORMAL LOW (ref 19–32)
CREATININE: 4.67 mg/dL — AB (ref 0.50–1.35)
Calcium: 9.6 mg/dL (ref 8.4–10.5)
GFR calc Af Amer: 12 mL/min — ABNORMAL LOW (ref >90)
GFR calc non Af Amer: 11 mL/min — ABNORMAL LOW (ref >90)
GLUCOSE: 107 mg/dL — AB (ref 70–99)
POTASSIUM: 4.1 meq/L (ref 3.7–5.3)
Phosphorus: 6.2 mg/dL — ABNORMAL HIGH (ref 2.3–4.6)
Sodium: 150 mEq/L — ABNORMAL HIGH (ref 137–147)

## 2014-05-23 LAB — URINE CULTURE
COLONY COUNT: NO GROWTH
Culture: NO GROWTH

## 2014-05-23 LAB — CBC
HEMATOCRIT: 39.8 % (ref 39.0–52.0)
HEMOGLOBIN: 13 g/dL (ref 13.0–17.0)
MCH: 30.4 pg (ref 26.0–34.0)
MCHC: 32.7 g/dL (ref 30.0–36.0)
MCV: 93.2 fL (ref 78.0–100.0)
Platelets: 182 10*3/uL (ref 150–400)
RBC: 4.27 MIL/uL (ref 4.22–5.81)
RDW: 13.7 % (ref 11.5–15.5)
WBC: 13.6 10*3/uL — ABNORMAL HIGH (ref 4.0–10.5)

## 2014-05-23 MED ORDER — DEXTROSE 5 % IV SOLN
INTRAVENOUS | Status: DC
  Administered 2014-05-23: 09:00:00 via INTRAVENOUS
  Filled 2014-05-23 (×2): qty 150

## 2014-05-23 MED ORDER — SODIUM BICARBONATE 650 MG PO TABS
650.0000 mg | ORAL_TABLET | Freq: Three times a day (TID) | ORAL | Status: DC
  Administered 2014-05-23 – 2014-05-28 (×16): 650 mg via ORAL
  Filled 2014-05-23 (×18): qty 1

## 2014-05-23 MED ORDER — DEXTROSE 5 % IV SOLN
INTRAVENOUS | Status: DC
  Administered 2014-05-23: 1000 mL via INTRAVENOUS
  Administered 2014-05-23 (×2): via INTRAVENOUS
  Administered 2014-05-24: 50 mL via INTRAVENOUS
  Administered 2014-05-26 – 2014-05-27 (×2): via INTRAVENOUS

## 2014-05-23 MED ORDER — SODIUM BICARBONATE 8.4 % IV SOLN: INTRAVENOUS | Status: DC

## 2014-05-23 NOTE — Progress Notes (Signed)
ANTIBIOTIC CONSULT NOTE   Pharmacy Consult: Zosyn Indication: Aspiration Pneumonia  Allergies  Allergen Reactions  . Penicillins Rash    Patient Measurements: Height: 5\' 8"  (172.7 cm) Weight: 157 lb (71.215 kg) IBW/kg (Calculated) : 68.4 Adjusted Body Weight:   Vital Signs: Temp: 97.7 F (36.5 C) (12/03 0535) Temp Source: Oral (12/03 0535) BP: 153/86 mmHg (12/03 0535) Pulse Rate: 85 (12/03 0535) Intake/Output from previous day: 12/02 0701 - 12/03 0700 In: 300 [P.O.:300] Out: 1500 [Urine:1500] Intake/Output from this shift:    Labs:  Recent Labs  05/21/14 0540 05/22/14 0602 05/23/14 0635  WBC 15.9* 13.0* 13.6*  HGB 13.4 12.7* 13.0  PLT 195 183 182  CREATININE 4.64* 4.78* 4.67*   Estimated Creatinine Clearance: 12 mL/min (by C-G formula based on Cr of 4.67). No results for input(s): VANCOTROUGH, VANCOPEAK, VANCORANDOM, GENTTROUGH, GENTPEAK, GENTRANDOM, TOBRATROUGH, TOBRAPEAK, TOBRARND, AMIKACINPEAK, AMIKACINTROU, AMIKACIN in the last 72 hours.   Microbiology: No results found for this or any previous visit (from the past 720 hour(s)).  Medical History: Past Medical History  Diagnosis Date  . Resting tremor   . Memory loss   . Unspecified cerebral artery occlusion with cerebral infarction   . Secondary parkinsonism   . High cholesterol   . Bipolar disorder   . Hypothyroidism   . Dementia     Medications:  Scheduled:  . antiseptic oral rinse  7 mL Mouth Rinse BID  . divalproex  250 mg Oral Daily  . donepezil  10 mg Oral Daily  . finasteride  5 mg Oral Daily  . heparin  5,000 Units Subcutaneous 3 times per day  . levothyroxine  50 mcg Oral QAC breakfast  . mirabegron ER  25 mg Oral Daily  . ondansetron (ZOFRAN) IV  4 mg Intravenous 4 times per day  . pantoprazole  40 mg Oral Q breakfast  . piperacillin-tazobactam (ZOSYN)  IV  2.25 g Intravenous 3 times per day  . sodium bicarbonate  650 mg Oral TID  . trihexyphenidyl  2 mg Oral Daily    Assessment: 80 yr old male admitted on 11/28 after a couple of days of N/V. He is s/p endoscopy with removal of impacted food.  His SrCr remains elevated but is slightly improving.  Renal is following.  WBC remains elevated, Afeb  CXR negative.  Plan to check UA with cultures.  Goal of Therapy:  Resolution of infection  Plan:  Zosyn 2.25 Gm IV q8h.  Follow renal function and adjust dose as appropriate.   Evelia Waskey Poteet 05/23/2014,10:45 AM

## 2014-05-23 NOTE — Progress Notes (Signed)
S: No N/V  able to eat and drink without problems O:BP 153/86 mmHg  Pulse 85  Temp(Src) 97.7 F (36.5 C) (Oral)  Resp 18  Ht _0  (1.727 m)  Wt 71.215 kg (157 lb)  BMI 23.88 kg/m2  SpO2 91%  Intake/Output Summary (Last 24 hours) at 05/23/14 0942 Last data filed at 05/23/14 8032  Gross per 24 hour  Intake    300 ml  Output   1500 ml  Net  -1200 ml   Weight change:  ZYY:QMGNO and alert CVS:RRR Resp:clear Abd:+ BS NTND Ext: No edema NEURO:CNI no asterixis   . antiseptic oral rinse  7 mL Mouth Rinse BID  . divalproex  250 mg Oral Daily  . donepezil  10 mg Oral Daily  . finasteride  5 mg Oral Daily  . heparin  5,000 Units Subcutaneous 3 times per day  . levothyroxine  50 mcg Oral QAC breakfast  . mirabegron ER  25 mg Oral Daily  . ondansetron (ZOFRAN) IV  4 mg Intravenous 4 times per day  . pantoprazole  40 mg Oral Q breakfast  . piperacillin-tazobactam (ZOSYN)  IV  2.25 g Intravenous 3 times per day  . trihexyphenidyl  2 mg Oral Daily   Dg Chest Port 1 View  05/22/2014   CLINICAL DATA:  Leukocytosis.  Weakness.  EXAM: PORTABLE CHEST - 1 VIEW  COMPARISON:  05/19/2014.  01/31/2013.  FINDINGS: Low volume chest. Probable superimposed interstitial pulmonary edema. Basilar atelectasis. Tortuous or ectatic thoracic aortic arch with atherosclerosis. Cardiopericardial silhouette is probably unchanged allowing for lower lung volumes.  No gross focal consolidation. Probable calcified lymph node in the RIGHT hilum.  IMPRESSION: Low volume chest with probable interstitial pulmonary edema.   Electronically Signed   By: Dereck Ligas M.D.   On: 05/22/2014 08:53   BMET    Component Value Date/Time   NA 150* 05/23/2014 0635   K 4.1 05/23/2014 0635   CL 114* 05/23/2014 0635   CO2 16* 05/23/2014 0635   GLUCOSE 107* 05/23/2014 0635   BUN 69* 05/23/2014 0635   CREATININE 4.67* 05/23/2014 0635   CALCIUM 9.6 05/23/2014 0635   GFRNONAA 11* 05/23/2014 0635   GFRAA 12* 05/23/2014 0635    CBC    Component Value Date/Time   WBC 13.6* 05/23/2014 0635   RBC 4.27 05/23/2014 0635   HGB 13.0 05/23/2014 0635   HCT 39.8 05/23/2014 0635   PLT 182 05/23/2014 0635   MCV 93.2 05/23/2014 0635   MCH 30.4 05/23/2014 0635   MCHC 32.7 05/23/2014 0635   RDW 13.7 05/23/2014 0635   LYMPHSABS 0.9 05/22/2014 0602   MONOABS 0.8 05/22/2014 0602   EOSABS 0.1 05/22/2014 0602   BASOSABS 0.0 05/22/2014 0602     Assessment: 1. N/V sec to esophageal food impaction 2. Hypernatremia, he is unable to keep up with PO intake at present 3. Acute on CKD 3 most likely  sec ATN, non oliguric. Scr sl lower today so hopefull renal fx will start to improve 4. Met Acidosis  Plan: 1. DC IV bicarb 2. Resume D5W 3. Oral bicarb 4 Daily labs Ilse Billman T

## 2014-05-23 NOTE — Plan of Care (Signed)
Problem: Phase II Progression Outcomes Goal: Other Phase II Outcomes/Goals Outcome: Not Applicable Date Met:  98/24/29  Problem: Phase III Progression Outcomes Goal: Other Phase III Outcomes/Goals Outcome: Not Applicable Date Met:  98/06/99  Problem: Discharge Progression Outcomes Goal: Tolerating diet Outcome: Completed/Met Date Met:  05/23/14 Goal: Other Discharge Outcomes/Goals Outcome: Not Applicable Date Met:  96/72/27

## 2014-05-23 NOTE — Progress Notes (Signed)
[facility] PROGRESS NOTE  Fernando MoatJack W Ruiz UJW:[id number] DOB: 09/09/1930 DOA: 05/18/2014 PCP: Hoyle SauerAVVA,[name], MD  Assessment/Plan: #1 acute on chronic kidney disease stage III Likely secondary to volume depletion plus or minus mild ATN. Patient with good urine output. Patient is currently tolerating current diet. Advance diet to soft diet. Continue to encourage oral intake via fluids. Renal following and appreciate input and recommendations.  #2 hypernatremia Likely secondary to volume depletion. Patient unable to keep up with urine output as hypernatremia has gotten worse since yesterday after saline lock and IV fluids. Patient has been restarted on D5W. Per renal.  #3 nausea and vomiting secondary to esophageal food impaction Status post disimpaction. EGD. Patient swallowing better. Patient with no further nausea or vomiting. Continue supportive care. Advance diet as tolerated to soft diet. Follow.  #4 asymptomatic gallstones.  #5 leukocytosis Likely secondary to probable UTI. Urine cultures pending. Continue empiric IV Zosyn. Follow.  #6 bipolar disorder Continue risperdal and clonazepam.  #7 BPH Continue Proscar.  #8 hypertension Continue Proscar.  #9 transaminitis Abdominal ultrasound with gallstones. LFTs trending down. No further workup at this time.  #10 history of CVA Stable.  #11 hypothyroidism Continue Synthroid.  #12 prophylaxis Heparin for DVT prophylaxis. PPI for GI prophylaxis.  Code Status: DO NOT RESUSCITATE Family Communication: Updated patient and family at bedside. Disposition Plan: Skilled nursing facility when medically stable.   Consultants:  Gastroenterology: Dr. Madilyn FiremanHayes 05/19/2014  Nephrology: Dr. Briant CedarMattingly 05/20/2014  Procedures:  EGD 05/21/2014 per Dr. Evette CristalGanem  Chest x-ray 05/19/2014, 05/22/2014  Abdominal ultrasound 05/21/2014  Antibiotics:  IV Zosyn 05/20/2014  HPI/Subjective: Patient denies any nausea or vomiting.  Patient states swallowing has improved.  Objective: Filed Vitals:   05/23/14 1327  BP: 134/109  Pulse: 95  Temp: 97.9 F (36.6 C)  Resp: 14    Intake/Output Summary (Last 24 hours) at 05/23/14 1821 Last data filed at 05/23/14 1000  Gross per 24 hour  Intake    240 ml  Output   1500 ml  Net  -1260 ml   Filed Weights   05/18/14 1119  Weight: 71.215 kg (157 lb)    Exam:   General:  nad  Cardiovascular: rrr  Respiratory: ctab anterior lung fields  Abdomen:  Soft, nontender, nondistended, positive bowel sounds.  Musculoskeletal: No clubbing cyanosis or edema.  Data Reviewed: Basic Metabolic Panel:  Recent Labs Lab 05/20/14 1530 05/20/14 2125 05/21/14 0540 05/22/14 0602 05/23/14 0635  NA 156* 150* 148* 145 150*  K 4.1 3.7 3.4* 3.9 4.1  CL 116* 112 108 107 114*  CO2 16* 16* 19 17* 16*  GLUCOSE 125* 177* 117* 131* 107*  BUN 58* 64* 68* 67* 69*  CREATININE 3.98* 4.33* 4.64* 4.78* 4.67*  CALCIUM 10.4 9.8 9.4 9.2 9.6  MG  --   --   --  2.4  --   PHOS  --   --   --  5.6* 6.2*   Liver Function Tests:  Recent Labs Lab 05/18/14 1202 05/19/14 0420 05/20/14 0439 05/21/14 0540 05/22/14 0602 05/23/14 0635  AST 35 66* 72* 46* 44*  --   ALT 19 28 58* 60* 58*  --   ALKPHOS 63 64 73 74 102  --   BILITOT 0.6 0.8 1.4* 1.2 0.7  --   PROT 8.5* 8.1 8.0 7.4 6.9  --   ALBUMIN 4.4 4.1 3.9 3.3* 2.8* 2.6*    Recent Labs Lab 05/18/14 1202  LIPASE 31    Recent Labs Lab 05/20/14 1803  AMMONIA 36   CBC:  Recent Labs Lab 05/18/14 1202 05/19/14 0420 05/20/14 0439 05/21/14 0540 05/22/14 0602 05/23/14 0635  WBC 15.9* 18.6* 21.0* 15.9* 13.0* 13.6*  NEUTROABS 14.6*  --  19.0* 13.5* 11.1*  --   HGB 13.9 13.3 14.3 13.4 12.7* 13.0  HCT 42.5 42.2 45.2 42.0 39.7 39.8  MCV 95.7 94.8 95.6 94.0 92.8 93.2  PLT 227 244 215 195 183 182   Cardiac Enzymes:  Recent Labs Lab 05/18/14 1202  TROPONINI <0.30   BNP (last 3 results) No results for input(s): PROBNP in  the last 8760 hours. CBG: No results for input(s): GLUCAP in the last 168 hours.  Recent Results (from the past 240 hour(s))  Culture, Urine     Status: None   Collection Time: 05/22/14 11:28 AM  Result Value Ref Range Status   Specimen Description URINE, CATHETERIZED  Final   Special Requests NONE  Final   Culture  Setup Time   Final    05/22/2014 12:29 Performed at [facility]    Colony Count NO GROWTH Performed at [facility]   Final   Culture NO GROWTH Performed at [facility]   Final   Report Status 05/23/2014 FINAL  Final     Studies: Dg Chest Port 1 View  05/22/2014   CLINICAL DATA:  Leukocytosis.  Weakness.  EXAM: PORTABLE CHEST - 1 VIEW  COMPARISON:  05/19/2014.  01/31/2013.  FINDINGS: Low volume chest. Probable superimposed interstitial pulmonary edema. Basilar atelectasis. Tortuous or ectatic thoracic aortic arch with atherosclerosis. Cardiopericardial silhouette is probably unchanged allowing for lower lung volumes.  No gross focal consolidation. Probable calcified lymph node in the RIGHT hilum.  IMPRESSION: Low volume chest with probable interstitial pulmonary edema.   Electronically Signed   By: Andreas NewportGeoffrey  Lamke M.D.   On: 05/22/2014 08:53    Scheduled Meds: . antiseptic oral rinse  7 mL Mouth Rinse BID  . divalproex  250 mg Oral Daily  . donepezil  10 mg Oral Daily  . finasteride  5 mg Oral Daily  . heparin  5,000 Units Subcutaneous 3 times per day  . levothyroxine  50 mcg Oral QAC breakfast  . mirabegron ER  25 mg Oral Daily  . ondansetron (ZOFRAN) IV  4 mg Intravenous 4 times per day  . pantoprazole  40 mg Oral Q breakfast  . piperacillin-tazobactam (ZOSYN)  IV  2.25 g Intravenous 3 times per day  . sodium bicarbonate  650 mg Oral TID  . trihexyphenidyl  2 mg Oral Daily   Continuous Infusions: . dextrose 100 mL/hr at 05/23/14 1217    Principal Problem:   AKI (acute kidney injury) Active Problems:   Cerebral artery occlusion  with cerebral infarction   Secondary parkinsonism   High cholesterol   Bipolar disorder   Hypothyroidism   Dementia   Uncontrollable nausea and vomiting   Pyelonephritis   Nausea & vomiting   Esophageal obstruction due to food impaction   Duodenal ulcer: per EGD 21/1/15   Ulcerative esophagitis: where food impaction was per EGD 05/21/14   Leukocytosis   UTI (urinary tract infection)    Time spent: 40 mins    [facility] Campus[facility]OMPSON,Carl Bleecker MD [facility] Pager [phone]. If 7PM-7AM, please contact night-coverage at [email or web address], password [email or web address] 05/23/2014, 6:21 PM  LOS: 5 days

## 2014-05-24 LAB — CBC
HEMATOCRIT: 39.6 % (ref 39.0–52.0)
HEMOGLOBIN: 12.8 g/dL — AB (ref 13.0–17.0)
MCH: 30.2 pg (ref 26.0–34.0)
MCHC: 32.3 g/dL (ref 30.0–36.0)
MCV: 93.4 fL (ref 78.0–100.0)
Platelets: 185 10*3/uL (ref 150–400)
RBC: 4.24 MIL/uL (ref 4.22–5.81)
RDW: 13.8 % (ref 11.5–15.5)
WBC: 11.8 10*3/uL — ABNORMAL HIGH (ref 4.0–10.5)

## 2014-05-24 LAB — BASIC METABOLIC PANEL
Anion gap: 16 — ABNORMAL HIGH (ref 5–15)
BUN: 67 mg/dL — ABNORMAL HIGH (ref 6–23)
CHLORIDE: 111 meq/L (ref 96–112)
CO2: 22 meq/L (ref 19–32)
Calcium: 9.2 mg/dL (ref 8.4–10.5)
Creatinine, Ser: 4.37 mg/dL — ABNORMAL HIGH (ref 0.50–1.35)
GFR calc Af Amer: 13 mL/min — ABNORMAL LOW (ref >90)
GFR calc non Af Amer: 12 mL/min — ABNORMAL LOW (ref >90)
Glucose, Bld: 117 mg/dL — ABNORMAL HIGH (ref 70–99)
POTASSIUM: 3.5 meq/L — AB (ref 3.7–5.3)
Sodium: 149 mEq/L — ABNORMAL HIGH (ref 137–147)

## 2014-05-24 MED ORDER — POTASSIUM CHLORIDE CRYS ER 20 MEQ PO TBCR
EXTENDED_RELEASE_TABLET | ORAL | Status: AC
  Filled 2014-05-24: qty 2

## 2014-05-24 MED ORDER — POTASSIUM CHLORIDE CRYS ER 20 MEQ PO TBCR
40.0000 meq | EXTENDED_RELEASE_TABLET | Freq: Once | ORAL | Status: AC
  Administered 2014-05-24: 40 meq via ORAL

## 2014-05-24 NOTE — Progress Notes (Signed)
[facility] PROGRESS NOTE  Fernando MoatJack W Majette WUJ:[id number] DOB: 01/26/1931 DOA: 05/18/2014 PCP: Hoyle SauerAVVA,[name], MD  Assessment/Plan: #1 acute on chronic kidney disease stage III Likely secondary to volume depletion plus or minus mild ATN. Patient with good urine output. Patient is currently tolerating current diet. Continue current soft diet. Continue to encourage oral intake via fluids. Renal following and appreciate input and recommendations.  #2 hypernatremia Likely secondary to volume depletion. Patient unable to keep up with urine output as hypernatremia has gotten worse since yesterday after saline lock and IV fluids. Patient has been restarted on D5W. Per renal.  #3 nausea and vomiting secondary to esophageal food impaction Status post disimpaction. EGD. Patient swallowing better. Patient with no further nausea or vomiting. Continue supportive care. Continue soft diet. Follow.  #4 asymptomatic gallstones.  #5 leukocytosis Likely secondary to probable UTI. Urine cultures negative. D/C  IV Zosyn. Follow.  #6 bipolar disorder Continue risperdal and clonazepam.  #7 BPH Continue Proscar.  #8 hypertension Continue Proscar.  #9 transaminitis Abdominal ultrasound with gallstones. LFTs trending down. No further workup at this time.  #10 history of CVA Stable.  #11 hypothyroidism Continue Synthroid.  #12 prophylaxis Heparin for DVT prophylaxis. PPI for GI prophylaxis.  Code Status: DO NOT RESUSCITATE Family Communication: Updated patient and family at bedside. Disposition Plan: Skilled nursing facility when medically stable.   Consultants:  Gastroenterology: Dr. Madilyn FiremanHayes 05/19/2014  Nephrology: Dr. Briant CedarMattingly 05/20/2014  Procedures:  EGD 05/21/2014 per Dr. Evette CristalGanem  Chest x-ray 05/19/2014, 05/22/2014  Abdominal ultrasound 05/21/2014  Antibiotics:  IV Zosyn 05/20/2014>>>>05/24/14  HPI/Subjective: Patient denies any nausea or vomiting. Patient with no  complaints.  Objective: Filed Vitals:   05/24/14 0415  BP: 103/75  Pulse: 73  Temp: 97.9 F (36.6 C)  Resp: 16    Intake/Output Summary (Last 24 hours) at 05/24/14 1034 Last data filed at 05/24/14 0750  Gross per 24 hour  Intake    620 ml  Output   1200 ml  Net   -580 ml   Filed Weights   05/18/14 1119  Weight: 71.215 kg (157 lb)    Exam:   General:  nad  Cardiovascular: rrr  Respiratory: ctab anterior lung fields  Abdomen:  Soft, nontender, nondistended, positive bowel sounds.  Musculoskeletal: No clubbing cyanosis or edema.  Data Reviewed: Basic Metabolic Panel:  Recent Labs Lab 05/20/14 2125 05/21/14 0540 05/22/14 0602 05/23/14 0635 05/24/14 0535  NA 150* 148* 145 150* 149*  K 3.7 3.4* 3.9 4.1 3.5*  CL 112 108 107 114* 111  CO2 16* 19 17* 16* 22  GLUCOSE 177* 117* 131* 107* 117*  BUN 64* 68* 67* 69* 67*  CREATININE 4.33* 4.64* 4.78* 4.67* 4.37*  CALCIUM 9.8 9.4 9.2 9.6 9.2  MG  --   --  2.4  --   --   PHOS  --   --  5.6* 6.2*  --    Liver Function Tests:  Recent Labs Lab 05/18/14 1202 05/19/14 0420 05/20/14 0439 05/21/14 0540 05/22/14 0602 05/23/14 0635  AST 35 66* 72* 46* 44*  --   ALT 19 28 58* 60* 58*  --   ALKPHOS 63 64 73 74 102  --   BILITOT 0.6 0.8 1.4* 1.2 0.7  --   PROT 8.5* 8.1 8.0 7.4 6.9  --   ALBUMIN 4.4 4.1 3.9 3.3* 2.8* 2.6*    Recent Labs Lab 05/18/14 1202  LIPASE 31    Recent Labs Lab 05/20/14 1803  AMMONIA 36  CBC:  Recent Labs Lab 05/18/14 1202  05/20/14 0439 05/21/14 0540 05/22/14 0602 05/23/14 0635 05/24/14 0535  WBC 15.9*  < > 21.0* 15.9* 13.0* 13.6* 11.8*  NEUTROABS 14.6*  --  19.0* 13.5* 11.1*  --   --   HGB 13.9  < > 14.3 13.4 12.7* 13.0 12.8*  HCT 42.5  < > 45.2 42.0 39.7 39.8 39.6  MCV 95.7  < > 95.6 94.0 92.8 93.2 93.4  PLT 227  < > 215 195 183 182 185  < > = values in this interval not displayed. Cardiac Enzymes:  Recent Labs Lab 05/18/14 1202  TROPONINI <0.30   BNP (last 3  results) No results for input(s): PROBNP in the last 8760 hours. CBG: No results for input(s): GLUCAP in the last 168 hours.  Recent Results (from the past 240 hour(s))  Culture, Urine     Status: None   Collection Time: 05/22/14 11:28 AM  Result Value Ref Range Status   Specimen Description URINE, CATHETERIZED  Final   Special Requests NONE  Final   Culture  Setup Time   Final    05/22/2014 12:29 Performed at [facility]    Colony Count NO GROWTH Performed at [facility]   Final   Culture NO GROWTH Performed at [facility]   Final   Report Status 05/23/2014 FINAL  Final     Studies: No results found.  Scheduled Meds: . antiseptic oral rinse  7 mL Mouth Rinse BID  . divalproex  250 mg Oral Daily  . donepezil  10 mg Oral Daily  . finasteride  5 mg Oral Daily  . heparin  5,000 Units Subcutaneous 3 times per day  . levothyroxine  50 mcg Oral QAC breakfast  . mirabegron ER  25 mg Oral Daily  . ondansetron (ZOFRAN) IV  4 mg Intravenous 4 times per day  . pantoprazole  40 mg Oral Q breakfast  . piperacillin-tazobactam (ZOSYN)  IV  2.25 g Intravenous 3 times per day  . potassium chloride  40 mEq Oral Once  . sodium bicarbonate  650 mg Oral TID  . trihexyphenidyl  2 mg Oral Daily   Continuous Infusions: . dextrose 1,000 mL (05/23/14 1951)    Principal Problem:   AKI (acute kidney injury) Active Problems:   Cerebral artery occlusion with cerebral infarction   Secondary parkinsonism   High cholesterol   Bipolar disorder   Hypothyroidism   Dementia   Uncontrollable nausea and vomiting   Pyelonephritis   Nausea & vomiting   Esophageal obstruction due to food impaction   Duodenal ulcer: per EGD 21/1/15   Ulcerative esophagitis: where food impaction was per EGD 05/21/14   Leukocytosis   UTI (urinary tract infection)   Hypernatremia    Time spent: 40 mins    [facility] LaredoHOMPSON,DANIEL MD [facility] Pager [phone]. If 7PM-7AM, please  contact night-coverage at [email or web address], password [email or web address] 05/24/2014, 10:34 AM  LOS: 6 days

## 2014-05-24 NOTE — Progress Notes (Signed)
Physical Therapy Treatment Patient Details Name: Fernando MoatJack W Pilat MRN: [id number] DOB: 12/08/1930 Today's Date: 05/24/2014    History of Present Illness 80 year old male with history of secondary parkinsonism, potential dementia, see daily stage II, multiple esophageal dilatations in past 4 years, psychiatric disorders, osteoporosis, hypothyroidism, prior CVA admitted with intractable nausea vomiting 11/29.     PT Comments    Patient able to improve sitting balance and standing balance over time.  Patient with decreased initiation and slow processing.  Will benefit from SNF level therapies at d/c.  Follow Up Recommendations  SNF;Supervision/Assistance - 24 hour     Equipment Recommendations  Rolling walker with 5" wheels;3in1 (PT) (unsure of equipment already in home)    Recommendations for Other Services       Precautions / Restrictions Precautions Precautions: Fall Restrictions Weight Bearing Restrictions: No    Mobility  Bed Mobility Overal bed mobility: Needs Assistance Bed Mobility: Rolling;Sidelying to Sit Rolling: Mod assist Sidelying to sit: Mod assist       General bed mobility comments: max cues and increased time for patient to participate, decreased initiation and weakness so support provided to complete activity  Transfers Overall transfer level: Needs assistance Equipment used: Rolling walker (2 wheeled) Transfers: Sit to/from Stand Sit to Stand: Mod assist;Max assist Stand pivot transfers: Min assist;Mod assist       General transfer comment: lifting assist and increased time for patient to initiate trunk and hip extension; able to straighten up eventially, but still with posterior and right loss of balance requiring mod to min assist for balance; to chair patient able to pivot with walker and mod cues and assist for weight shift; encouraged ambulation, but patient wished to go only to chair  Ambulation/Gait                 Stairs             Wheelchair Mobility    Modified Rankin (Stroke Patients Only)       Balance Overall balance assessment: Needs assistance   Sitting balance-Leahy Scale: Poor Sitting balance - Comments: sat edge of bed x about 7 minutes initially falling right, cues for balance and increased self support over time, able to wash face sitting edge of bed with min support and cues for balance Postural control: Posterior lean;Right lateral lean Standing balance support: Bilateral upper extremity supported;Single extremity supported;During functional activity Standing balance-Leahy Scale: Poor Standing balance comment: again leans back and to right with decreased left weight shift; patient resistent to facilitation for left weight shift in standing; varied between mod and min support with walker for balance; stood about 2 minutes for perineal hygiene                    Cognition Arousal/Alertness: Awake/alert Behavior During Therapy: WFL for tasks assessed/performed Overall Cognitive Status: History of cognitive impairments - at baseline                      Exercises General Exercises - Lower Extremity Ankle Circles/Pumps: AROM;10 reps;Supine;Both Heel Slides: AAROM;Both;10 reps;Supine    General Comments        Pertinent Vitals/Pain Pain Assessment: No/denies pain    Home Living                      Prior Function            PT Goals (current goals can now be found in the care plan section)  Progress towards PT goals: Progressing toward goals    Frequency  Min 2X/week    PT Plan Current plan remains appropriate    Co-evaluation             End of Session Equipment Utilized During Treatment: Gait belt;Oxygen Activity Tolerance: Patient limited by fatigue Patient left: in chair;with call bell/phone within reach;with nursing/sitter in room     Time: 0940-1008 PT Time Calculation (min) (ACUTE ONLY): 28 min  Charges:  $Therapeutic Activity: 8-22  mins $Neuromuscular Re-education: 8-22 mins                    G Codes:      WYNN,CYNDI 05/24/2014, 10:55 AM Sheran Lawlessyndi Wynn, PT [phone] 05/24/2014

## 2014-05-24 NOTE — Progress Notes (Signed)
S: Sitting in chair eating. O:BP 103/75 mmHg  Pulse 73  Temp(Src) 97.9 F (36.6 C) (Oral)  Resp 16  Ht '5\' 8"'  (1.727 m)  Wt 71.215 kg (157 lb)  BMI 23.88 kg/m2  SpO2 95%  Intake/Output Summary (Last 24 hours) at 05/24/14 1101 Last data filed at 05/24/14 0750  Gross per 24 hour  Intake    620 ml  Output   1200 ml  Net   -580 ml   Weight change:  BJS:EGBTD and alert CVS:RRR Resp:clear Abd:+ BS NTND Ext: No edema NEURO:CNI no asterixis   . antiseptic oral rinse  7 mL Mouth Rinse BID  . divalproex  250 mg Oral Daily  . donepezil  10 mg Oral Daily  . finasteride  5 mg Oral Daily  . heparin  5,000 Units Subcutaneous 3 times per day  . levothyroxine  50 mcg Oral QAC breakfast  . mirabegron ER  25 mg Oral Daily  . ondansetron (ZOFRAN) IV  4 mg Intravenous 4 times per day  . pantoprazole  40 mg Oral Q breakfast  . piperacillin-tazobactam (ZOSYN)  IV  2.25 g Intravenous 3 times per day  . potassium chloride  40 mEq Oral Once  . sodium bicarbonate  650 mg Oral TID  . trihexyphenidyl  2 mg Oral Daily   No results found. BMET    Component Value Date/Time   NA 149* 05/24/2014 0535   K 3.5* 05/24/2014 0535   CL 111 05/24/2014 0535   CO2 22 05/24/2014 0535   GLUCOSE 117* 05/24/2014 0535   BUN 67* 05/24/2014 0535   CREATININE 4.37* 05/24/2014 0535   CALCIUM 9.2 05/24/2014 0535   GFRNONAA 12* 05/24/2014 0535   GFRAA 13* 05/24/2014 0535   CBC    Component Value Date/Time   WBC 11.8* 05/24/2014 0535   RBC 4.24 05/24/2014 0535   HGB 12.8* 05/24/2014 0535   HCT 39.6 05/24/2014 0535   PLT 185 05/24/2014 0535   MCV 93.4 05/24/2014 0535   MCH 30.2 05/24/2014 0535   MCHC 32.3 05/24/2014 0535   RDW 13.8 05/24/2014 0535   LYMPHSABS 0.9 05/22/2014 0602   MONOABS 0.8 05/22/2014 0602   EOSABS 0.1 05/22/2014 0602   BASOSABS 0.0 05/22/2014 0602     Assessment: 1. N/V sec to esophageal food impaction 2. Hypernatremia, improving 3. Acute on CKD 3 most likely  sec ATN, non  oliguric. Scr cont to improve, slowly 4. Met Acidosis, improved 5. Mild hypokalemia., replacement ordered  Plan: 1. Decrease IV to 50cc/hr and see how he does will oral intake 2. Recheck labs in AM  Kyliyah Stirn T

## 2014-05-24 NOTE — Clinical Social Work Note (Signed)
SNF bed accepted at [facility]- per MD, patient not medically ready for transfer due to renal and other issues. I have updated [facility] SNF and they will hope to take him when he is medically stable. Wife also updated by phone- patient in good spirits- "tell the doc I'm ready to go!"  CSW will follow-  Fernando LevyJanet Jefrey Ruiz, MSW, [employer]LCSWA [phone]

## 2014-05-24 NOTE — Progress Notes (Signed)
Medicare Important Message given?  YES (If response is "NO", the following Medicare IM given date fields will be blank) Date Medicare IM given:   Medicare IM given by:  Armas Mcbee 

## 2014-05-25 LAB — RENAL FUNCTION PANEL
Albumin: 2.4 g/dL — ABNORMAL LOW (ref 3.5–5.2)
Anion gap: 18 — ABNORMAL HIGH (ref 5–15)
BUN: 61 mg/dL — AB (ref 6–23)
CALCIUM: 10 mg/dL (ref 8.4–10.5)
CO2: 21 mEq/L (ref 19–32)
Chloride: 108 mEq/L (ref 96–112)
Creatinine, Ser: 4.02 mg/dL — ABNORMAL HIGH (ref 0.50–1.35)
GFR calc Af Amer: 15 mL/min — ABNORMAL LOW (ref >90)
GFR calc non Af Amer: 13 mL/min — ABNORMAL LOW (ref >90)
GLUCOSE: 100 mg/dL — AB (ref 70–99)
Phosphorus: 5.1 mg/dL — ABNORMAL HIGH (ref 2.3–4.6)
Potassium: 3.8 mEq/L (ref 3.7–5.3)
SODIUM: 147 meq/L (ref 137–147)

## 2014-05-25 LAB — CBC
HCT: 38 % — ABNORMAL LOW (ref 39.0–52.0)
HEMOGLOBIN: 12.4 g/dL — AB (ref 13.0–17.0)
MCH: 31.1 pg (ref 26.0–34.0)
MCHC: 32.6 g/dL (ref 30.0–36.0)
MCV: 95.2 fL (ref 78.0–100.0)
Platelets: 204 10*3/uL (ref 150–400)
RBC: 3.99 MIL/uL — AB (ref 4.22–5.81)
RDW: 13.7 % (ref 11.5–15.5)
WBC: 11.3 10*3/uL — ABNORMAL HIGH (ref 4.0–10.5)

## 2014-05-25 NOTE — Progress Notes (Signed)
S: Sitting in chair. No new CO O:BP 115/75 mmHg  Pulse 75  Temp(Src) 97.3 F (36.3 C) (Oral)  Resp 16  Ht '5\' 8"'  (1.727 m)  Wt 71.215 kg (157 lb)  BMI 23.88 kg/m2  SpO2 91%  Intake/Output Summary (Last 24 hours) at 05/25/14 1020 Last data filed at 05/25/14 0849  Gross per 24 hour  Intake   1345 ml  Output   2716 ml  Net  -1371 ml   Weight change:  VOJ:JKKXF and alert CVS:RRR Resp:clear Abd:+ BS NTND Ext: No edema NEURO:CNI no asterixis   . antiseptic oral rinse  7 mL Mouth Rinse BID  . divalproex  250 mg Oral Daily  . donepezil  10 mg Oral Daily  . finasteride  5 mg Oral Daily  . heparin  5,000 Units Subcutaneous 3 times per day  . levothyroxine  50 mcg Oral QAC breakfast  . mirabegron ER  25 mg Oral Daily  . ondansetron (ZOFRAN) IV  4 mg Intravenous 4 times per day  . pantoprazole  40 mg Oral Q breakfast  . sodium bicarbonate  650 mg Oral TID  . trihexyphenidyl  2 mg Oral Daily   No results found. BMET    Component Value Date/Time   NA 147 05/25/2014 0545   K 3.8 05/25/2014 0545   CL 108 05/25/2014 0545   CO2 21 05/25/2014 0545   GLUCOSE 100* 05/25/2014 0545   BUN 61* 05/25/2014 0545   CREATININE 4.02* 05/25/2014 0545   CALCIUM 10.0 05/25/2014 0545   GFRNONAA 13* 05/25/2014 0545   GFRAA 15* 05/25/2014 0545   CBC    Component Value Date/Time   WBC 11.3* 05/25/2014 0545   RBC 3.99* 05/25/2014 0545   HGB 12.4* 05/25/2014 0545   HCT 38.0* 05/25/2014 0545   PLT 204 05/25/2014 0545   MCV 95.2 05/25/2014 0545   MCH 31.1 05/25/2014 0545   MCHC 32.6 05/25/2014 0545   RDW 13.7 05/25/2014 0545   LYMPHSABS 0.9 05/22/2014 0602   MONOABS 0.8 05/22/2014 0602   EOSABS 0.1 05/22/2014 0602   BASOSABS 0.0 05/22/2014 0602     Assessment: 1. N/V sec to esophageal food impaction 2. Hypernatremia, improving 3. Acute on CKD 3 most likely  sec ATN, non oliguric. Scr cont to trend down 4. Met Acidosis, improved 5. Mild hypokalemia., improved  Plan: 1. I still  have concerns regarding his ability to keep up will UO with PO fluids.  Cont IV for now 2. Recheck labs in AM  3. DC foley Valinda Fedie T

## 2014-05-25 NOTE — Progress Notes (Signed)
[facility] PROGRESS NOTE  Fernando MoatJack W Sulton ZOX:[id number] DOB: 07/11/1930 DOA: 05/18/2014 PCP: Hoyle SauerAVVA,[name], MD  Assessment/Plan: #1 acute on chronic kidney disease stage III Likely secondary to volume depletion plus or minus mild ATN. Patient with good urine output. Patient is currently tolerating current diet. Continue current soft diet. Continue to encourage oral intake via fluids. Renal function slowly trending down. Follow. Renal following and appreciate input and recommendations.  #2 hypernatremia Likely secondary to volume depletion. Patient unable to keep up with urine output. Hyponatremia slowly improving with IV fluids. Continue D5W per renal.  #3 nausea and vomiting secondary to esophageal food impaction Status post disimpaction. EGD. Patient swallowing better. Patient with no further nausea or vomiting. Continue supportive care. Continue soft diet. Follow.  #4 asymptomatic gallstones.  #5 leukocytosis Likely secondary to probable UTI. Urine cultures negative. D/C'd  IV Zosyn. Follow.  #6 bipolar disorder Continue risperdal and clonazepam.  #7 BPH Continue Proscar.  #8 hypertension Continue Proscar.  #9 transaminitis Abdominal ultrasound with gallstones. LFTs trending down. No further workup at this time.  #10 history of CVA Stable.  #11 hypothyroidism Continue Synthroid.  #12 prophylaxis Heparin for DVT prophylaxis. PPI for GI prophylaxis.  Code Status: DO NOT RESUSCITATE Family Communication: Updated patient and family at bedside. Disposition Plan: Skilled nursing facility when medically stable.   Consultants:  Gastroenterology: Dr. Madilyn FiremanHayes 05/19/2014  Nephrology: Dr. Briant CedarMattingly 05/20/2014  Procedures:  EGD 05/21/2014 per Dr. Evette CristalGanem  Chest x-ray 05/19/2014, 05/22/2014  Abdominal ultrasound 05/21/2014  Antibiotics:  IV Zosyn 05/20/2014>>>>05/25/14  HPI/Subjective: Patient denies any nausea or vomiting. Patient with no complaints.  Sitting up in chair.  Objective: Filed Vitals:   05/25/14 0552  BP: 115/75  Pulse: 75  Temp: 97.3 F (36.3 C)  Resp: 16    Intake/Output Summary (Last 24 hours) at 05/25/14 1100 Last data filed at 05/25/14 0849  Gross per 24 hour  Intake   1345 ml  Output   2716 ml  Net  -1371 ml   Filed Weights   05/18/14 1119  Weight: 71.215 kg (157 lb)    Exam:   General:  nad  Cardiovascular: rrr  Respiratory: ctab anterior lung fields  Abdomen:  Soft, nontender, nondistended, positive bowel sounds.  Musculoskeletal: No clubbing cyanosis or edema.  Data Reviewed: Basic Metabolic Panel:  Recent Labs Lab 05/21/14 0540 05/22/14 0602 05/23/14 0635 05/24/14 0535 05/25/14 0545  NA 148* 145 150* 149* 147  K 3.4* 3.9 4.1 3.5* 3.8  CL 108 107 114* 111 108  CO2 19 17* 16* 22 21  GLUCOSE 117* 131* 107* 117* 100*  BUN 68* 67* 69* 67* 61*  CREATININE 4.64* 4.78* 4.67* 4.37* 4.02*  CALCIUM 9.4 9.2 9.6 9.2 10.0  MG  --  2.4  --   --   --   PHOS  --  5.6* 6.2*  --  5.1*   Liver Function Tests:  Recent Labs Lab 05/18/14 1202 05/19/14 0420 05/20/14 0439 05/21/14 0540 05/22/14 0602 05/23/14 0635 05/25/14 0545  AST 35 66* 72* 46* 44*  --   --   ALT 19 28 58* 60* 58*  --   --   ALKPHOS 63 64 73 74 102  --   --   BILITOT 0.6 0.8 1.4* 1.2 0.7  --   --   PROT 8.5* 8.1 8.0 7.4 6.9  --   --   ALBUMIN 4.4 4.1 3.9 3.3* 2.8* 2.6* 2.4*    Recent Labs Lab 05/18/14 1202  LIPASE 31  Recent Labs Lab 05/20/14 1803  AMMONIA 36   CBC:  Recent Labs Lab 05/18/14 1202  05/20/14 0439 05/21/14 0540 05/22/14 0602 05/23/14 0635 05/24/14 0535 05/25/14 0545  WBC 15.9*  < > 21.0* 15.9* 13.0* 13.6* 11.8* 11.3*  NEUTROABS 14.6*  --  19.0* 13.5* 11.1*  --   --   --   HGB 13.9  < > 14.3 13.4 12.7* 13.0 12.8* 12.4*  HCT 42.5  < > 45.2 42.0 39.7 39.8 39.6 38.0*  MCV 95.7  < > 95.6 94.0 92.8 93.2 93.4 95.2  PLT 227  < > 215 195 183 182 185 204  < > = values in this interval  not displayed. Cardiac Enzymes:  Recent Labs Lab 05/18/14 1202  TROPONINI <0.30   BNP (last 3 results) No results for input(s): PROBNP in the last 8760 hours. CBG: No results for input(s): GLUCAP in the last 168 hours.  Recent Results (from the past 240 hour(s))  Culture, Urine     Status: None   Collection Time: 05/22/14 11:28 AM  Result Value Ref Range Status   Specimen Description URINE, CATHETERIZED  Final   Special Requests NONE  Final   Culture  Setup Time   Final    05/22/2014 12:29 Performed at [facility]    Colony Count NO GROWTH Performed at [facility]   Final   Culture NO GROWTH Performed at [facility]   Final   Report Status 05/23/2014 FINAL  Final     Studies: No results found.  Scheduled Meds: . antiseptic oral rinse  7 mL Mouth Rinse BID  . divalproex  250 mg Oral Daily  . donepezil  10 mg Oral Daily  . finasteride  5 mg Oral Daily  . heparin  5,000 Units Subcutaneous 3 times per day  . levothyroxine  50 mcg Oral QAC breakfast  . mirabegron ER  25 mg Oral Daily  . ondansetron (ZOFRAN) IV  4 mg Intravenous 4 times per day  . pantoprazole  40 mg Oral Q breakfast  . sodium bicarbonate  650 mg Oral TID  . trihexyphenidyl  2 mg Oral Daily   Continuous Infusions: . dextrose 50 mL (05/24/14 1758)    Principal Problem:   AKI (acute kidney injury) Active Problems:   Cerebral artery occlusion with cerebral infarction   Secondary parkinsonism   High cholesterol   Bipolar disorder   Hypothyroidism   Dementia   Uncontrollable nausea and vomiting   Pyelonephritis   Nausea & vomiting   Esophageal obstruction due to food impaction   Duodenal ulcer: per EGD 21/1/15   Ulcerative esophagitis: where food impaction was per EGD 05/21/14   Leukocytosis   UTI (urinary tract infection)   Hypernatremia    Time spent: 40 mins    [facility] LL[facility]OMPSON,DANIEL MD [facility] Pager [phone]. If 7PM-7AM, please contact  night-coverage at [email or web address], password [email or web address] 05/25/2014, 11:00 AM  LOS: 7 days

## 2014-05-25 NOTE — Clinical Social Work Note (Signed)
CSW continues to follow for d/c planning needs. CSW spoke with MD Janee Morn(Thompson) who states patient not ready for d/c on this date. CSW to follow tomorrow.   Dre Gamino Patrick-Jefferson, LCSWA Weekend Clinical Social Worker [phone]

## 2014-05-26 DIAGNOSIS — R112 Nausea with vomiting, unspecified: Secondary | ICD-10-CM | POA: Insufficient documentation

## 2014-05-26 LAB — BASIC METABOLIC PANEL
Anion gap: 15 (ref 5–15)
BUN: 50 mg/dL — ABNORMAL HIGH (ref 6–23)
CO2: 22 meq/L (ref 19–32)
Calcium: 9.5 mg/dL (ref 8.4–10.5)
Chloride: 109 mEq/L (ref 96–112)
Creatinine, Ser: 3.46 mg/dL — ABNORMAL HIGH (ref 0.50–1.35)
GFR calc Af Amer: 18 mL/min — ABNORMAL LOW (ref >90)
GFR calc non Af Amer: 15 mL/min — ABNORMAL LOW (ref >90)
GLUCOSE: 92 mg/dL (ref 70–99)
POTASSIUM: 3.9 meq/L (ref 3.7–5.3)
Sodium: 146 mEq/L (ref 137–147)

## 2014-05-26 NOTE — Progress Notes (Signed)
S: Sitting in chair. No new CO drinking fluids bit intake not recorded so tough to know how much O:BP 137/87 mmHg  Pulse 63  Temp(Src) 97.3 F (36.3 C) (Oral)  Resp 16  Ht '5\' 8"'  (1.727 m)  Wt 71.215 kg (157 lb)  BMI 23.88 kg/m2  SpO2 94%  Intake/Output Summary (Last 24 hours) at 05/26/14 0952 Last data filed at 05/26/14 0550  Gross per 24 hour  Intake    150 ml  Output   1500 ml  Net  -1350 ml   Weight change:  SLH:TDSKA and alert CVS:RRR Resp:clear Abd:+ BS NTND Ext: No edema NEURO:CNI no asterixis   . antiseptic oral rinse  7 mL Mouth Rinse BID  . divalproex  250 mg Oral Daily  . donepezil  10 mg Oral Daily  . finasteride  5 mg Oral Daily  . heparin  5,000 Units Subcutaneous 3 times per day  . levothyroxine  50 mcg Oral QAC breakfast  . mirabegron ER  25 mg Oral Daily  . ondansetron (ZOFRAN) IV  4 mg Intravenous 4 times per day  . pantoprazole  40 mg Oral Q breakfast  . sodium bicarbonate  650 mg Oral TID  . trihexyphenidyl  2 mg Oral Daily   No results found. BMET    Component Value Date/Time   NA 146 05/26/2014 0730   K 3.9 05/26/2014 0730   CL 109 05/26/2014 0730   CO2 22 05/26/2014 0730   GLUCOSE 92 05/26/2014 0730   BUN 50* 05/26/2014 0730   CREATININE 3.46* 05/26/2014 0730   CALCIUM 9.5 05/26/2014 0730   GFRNONAA 15* 05/26/2014 0730   GFRAA 18* 05/26/2014 0730   CBC    Component Value Date/Time   WBC 11.3* 05/25/2014 0545   RBC 3.99* 05/25/2014 0545   HGB 12.4* 05/25/2014 0545   HCT 38.0* 05/25/2014 0545   PLT 204 05/25/2014 0545   MCV 95.2 05/25/2014 0545   MCH 31.1 05/25/2014 0545   MCHC 32.6 05/25/2014 0545   RDW 13.7 05/25/2014 0545   LYMPHSABS 0.9 05/22/2014 0602   MONOABS 0.8 05/22/2014 0602   EOSABS 0.1 05/22/2014 0602   BASOSABS 0.0 05/22/2014 0602     Assessment: 1. N/V sec to esophageal food impaction 2. Hypernatremia, improving 3. Acute on CKD 3 most likely  sec ATN, non oliguric. Renal fx improving 4. Met Acidosis,  improved on PO bicarb 5. Mild hypokalemia., improved  Plan: 1. He will need another trial  Off IV fluids to see if he can keep up orally but would be nice to know how much he is actually taking in currently 2. Recheck labs in AM Maggie Dworkin T

## 2014-05-26 NOTE — Progress Notes (Signed)
[facility] PROGRESS NOTE  Fernando MoatJack W Ruiz RUE:[id number] DOB: 03/13/1931 DOA: 05/18/2014 PCP: Hoyle SauerAVVA,[name], MD  Assessment/Plan: #1 acute on chronic kidney disease stage III Likely secondary to volume depletion plus or minus mild ATN. Patient with good urine output. Patient is currently tolerating current diet. Continue current soft diet. Continue to encourage oral intake via fluids. Renal function slowly trending down. Follow. Renal following and appreciate input and recommendations.  #2 hypernatremia Likely secondary to volume depletion. Patient unable to keep up with urine output. Hypernatremia slowly improving with IV fluids. Continue D5W per renal. Will need trial off IVF.  #3 nausea and vomiting secondary to esophageal food impaction Status post disimpaction. EGD. Patient swallowing better. Patient with no further nausea or vomiting. Continue supportive care. Continue soft diet. Follow.  #4 asymptomatic gallstones.  #5 leukocytosis Likely secondary to probable UTI. Urine cultures negative. D/C'd  IV Zosyn. Follow.  #6 bipolar disorder Continue risperdal and clonazepam.  #7 BPH Continue Proscar.  #8 hypertension Continue Proscar.  #9 transaminitis Abdominal ultrasound with gallstones. LFTs trending down. No further workup at this time.  #10 history of CVA Stable.  #11 hypothyroidism Continue Synthroid.  #12 prophylaxis Heparin for DVT prophylaxis. PPI for GI prophylaxis.  Code Status: DO NOT RESUSCITATE Family Communication: Updated patient and family at bedside. Disposition Plan: Skilled nursing facility when medically stable.   Consultants:  Gastroenterology: Dr. Madilyn FiremanHayes 05/19/2014  Nephrology: Dr. Briant CedarMattingly 05/20/2014  Procedures:  EGD 05/21/2014 per Dr. Evette CristalGanem  Chest x-ray 05/19/2014, 05/22/2014  Abdominal ultrasound 05/21/2014  Antibiotics:  IV Zosyn 05/20/2014>>>>05/25/14  HPI/Subjective: Patient denies any nausea or vomiting. Patient  with no complaints. Sitting up in chair.  Objective: Filed Vitals:   05/26/14 1427  BP: 143/77  Pulse: 79  Temp:   Resp: 18    Intake/Output Summary (Last 24 hours) at 05/26/14 1644 Last data filed at 05/26/14 1633  Gross per 24 hour  Intake    150 ml  Output   1500 ml  Net  -1350 ml   Filed Weights   05/18/14 1119  Weight: 71.215 kg (157 lb)    Exam:   General:  nad  Cardiovascular: rrr  Respiratory: ctab anterior lung fields  Abdomen:  Soft, nontender, nondistended, positive bowel sounds.  Musculoskeletal: No clubbing cyanosis or edema.  Data Reviewed: Basic Metabolic Panel:  Recent Labs Lab 05/22/14 0602 05/23/14 0635 05/24/14 0535 05/25/14 0545 05/26/14 0730  NA 145 150* 149* 147 146  K 3.9 4.1 3.5* 3.8 3.9  CL 107 114* 111 108 109  CO2 17* 16* 22 21 22   GLUCOSE 131* 107* 117* 100* 92  BUN 67* 69* 67* 61* 50*  CREATININE 4.78* 4.67* 4.37* 4.02* 3.46*  CALCIUM 9.2 9.6 9.2 10.0 9.5  MG 2.4  --   --   --   --   PHOS 5.6* 6.2*  --  5.1*  --    Liver Function Tests:  Recent Labs Lab 05/20/14 0439 05/21/14 0540 05/22/14 0602 05/23/14 0635 05/25/14 0545  AST 72* 46* 44*  --   --   ALT 58* 60* 58*  --   --   ALKPHOS 73 74 102  --   --   BILITOT 1.4* 1.2 0.7  --   --   PROT 8.0 7.4 6.9  --   --   ALBUMIN 3.9 3.3* 2.8* 2.6* 2.4*   No results for input(s): LIPASE, AMYLASE in the last 168 hours.  Recent Labs Lab 05/20/14 1803  AMMONIA 36  CBC:  Recent Labs Lab 05/20/14 0439 05/21/14 0540 05/22/14 0602 05/23/14 0635 05/24/14 0535 05/25/14 0545  WBC 21.0* 15.9* 13.0* 13.6* 11.8* 11.3*  NEUTROABS 19.0* 13.5* 11.1*  --   --   --   HGB 14.3 13.4 12.7* 13.0 12.8* 12.4*  HCT 45.2 42.0 39.7 39.8 39.6 38.0*  MCV 95.6 94.0 92.8 93.2 93.4 95.2  PLT 215 195 183 182 185 204   Cardiac Enzymes: No results for input(s): CKTOTAL, CKMB, CKMBINDEX, TROPONINI in the last 168 hours. BNP (last 3 results) No results for input(s): PROBNP in the  last 8760 hours. CBG: No results for input(s): GLUCAP in the last 168 hours.  Recent Results (from the past 240 hour(s))  Culture, Urine     Status: None   Collection Time: 05/22/14 11:28 AM  Result Value Ref Range Status   Specimen Description URINE, CATHETERIZED  Final   Special Requests NONE  Final   Culture  Setup Time   Final    05/22/2014 12:29 Performed at [facility]    Colony Count NO GROWTH Performed at [facility]   Final   Culture NO GROWTH Performed at [facility]   Final   Report Status 05/23/2014 FINAL  Final     Studies: No results found.  Scheduled Meds: . antiseptic oral rinse  7 mL Mouth Rinse BID  . divalproex  250 mg Oral Daily  . donepezil  10 mg Oral Daily  . finasteride  5 mg Oral Daily  . heparin  5,000 Units Subcutaneous 3 times per day  . levothyroxine  50 mcg Oral QAC breakfast  . mirabegron ER  25 mg Oral Daily  . ondansetron (ZOFRAN) IV  4 mg Intravenous 4 times per day  . pantoprazole  40 mg Oral Q breakfast  . sodium bicarbonate  650 mg Oral TID  . trihexyphenidyl  2 mg Oral Daily   Continuous Infusions: . dextrose 50 mL/hr at 05/26/14 1306    Principal Problem:   AKI (acute kidney injury) Active Problems:   Cerebral artery occlusion with cerebral infarction   Secondary parkinsonism   High cholesterol   Bipolar disorder   Hypothyroidism   Dementia   Uncontrollable nausea and vomiting   Pyelonephritis   Nausea & vomiting   Esophageal obstruction due to food impaction   Duodenal ulcer: per EGD 21/1/15   Ulcerative esophagitis: where food impaction was per EGD 05/21/14   Leukocytosis   UTI (urinary tract infection)   Hypernatremia    Time spent: 40 mins    [facility] Center[facility]OMPSON,Brandan Robicheaux MD [facility] Pager [phone]. If 7PM-7AM, please contact night-coverage at [email or web address], password [email or web address] 05/26/2014, 4:44 PM  LOS: 8 days

## 2014-05-27 LAB — BASIC METABOLIC PANEL
Anion gap: 16 — ABNORMAL HIGH (ref 5–15)
BUN: 47 mg/dL — ABNORMAL HIGH (ref 6–23)
CO2: 21 meq/L (ref 19–32)
Calcium: 9.6 mg/dL (ref 8.4–10.5)
Chloride: 109 mEq/L (ref 96–112)
Creatinine, Ser: 3.44 mg/dL — ABNORMAL HIGH (ref 0.50–1.35)
GFR calc Af Amer: 18 mL/min — ABNORMAL LOW (ref >90)
GFR calc non Af Amer: 15 mL/min — ABNORMAL LOW (ref >90)
GLUCOSE: 102 mg/dL — AB (ref 70–99)
POTASSIUM: 3.6 meq/L — AB (ref 3.7–5.3)
SODIUM: 146 meq/L (ref 137–147)

## 2014-05-27 MED ORDER — POTASSIUM CHLORIDE CRYS ER 20 MEQ PO TBCR
40.0000 meq | EXTENDED_RELEASE_TABLET | Freq: Once | ORAL | Status: AC
  Administered 2014-05-27: 40 meq via ORAL
  Filled 2014-05-27: qty 2

## 2014-05-27 NOTE — Progress Notes (Signed)
Medicare Important Message given?  YES (If response is "NO", the following Medicare IM given date fields will be blank) Date Medicare IM given:  05/27/14 Medicare IM given by:  Tjuana Vickrey 

## 2014-05-27 NOTE — Progress Notes (Signed)
[facility] PROGRESS NOTE  Fernando Ruiz ZOX:[id number] DOB: 07/04/1930 DOA: 05/18/2014 PCP: Hoyle SauerAVVA,[name], MD  Assessment/Plan: #1 acute on chronic kidney disease stage III Likely secondary to volume depletion plus or minus mild ATN. Patient with good urine output. Patient is currently tolerating current diet. Continue current soft diet. Continue to encourage oral intake via fluids. Renal function slowly trending down. Follow. Renal following and appreciate input and recommendations.  #2 hypernatremia Likely secondary to volume depletion. Patient unable to keep up with urine output. Hypernatremia slowly improving with IV fluids. Will discontinue IV fluids with a trial off IV fluids and follow BMET in AM.  #3 nausea and vomiting secondary to esophageal food impaction Status post disimpaction. EGD. Patient swallowing better. Patient with no further nausea or vomiting. Continue supportive care. Continue soft diet. Follow.  #4 asymptomatic gallstones.  #5 leukocytosis Likely secondary to probable UTI. Urine cultures negative. D/C'd  IV Zosyn. Follow.  #6 bipolar disorder Continue risperdal and clonazepam.  #7 BPH Continue Proscar.  #8 hypertension Continue Proscar.  #9 transaminitis Abdominal ultrasound with gallstones. LFTs trending down. No further workup at this time.  #10 history of CVA Stable.  #11 hypothyroidism Continue Synthroid.  #12 prophylaxis Heparin for DVT prophylaxis. PPI for GI prophylaxis.  Code Status: DO NOT RESUSCITATE Family Communication: Updated patient and family at bedside. Disposition Plan: Skilled nursing facility when medically stable.   Consultants:  Gastroenterology: Dr. Madilyn FiremanHayes 05/19/2014  Nephrology: Dr. Briant CedarMattingly 05/20/2014  Procedures:  EGD 05/21/2014 per Dr. Evette CristalGanem  Chest x-ray 05/19/2014, 05/22/2014  Abdominal ultrasound 05/21/2014  Antibiotics:  IV Zosyn 05/20/2014>>>>05/25/14  HPI/Subjective: Patient denies any  nausea or vomiting. Patient with no complaints.   Objective: Filed Vitals:   05/27/14 0617  BP:   Pulse:   Temp: 98.1 F (36.7 C)  Resp:     Intake/Output Summary (Last 24 hours) at 05/27/14 1026 Last data filed at 05/27/14 1012  Gross per 24 hour  Intake 1507.5 ml  Output   2300 ml  Net -792.5 ml   Filed Weights   05/18/14 1119  Weight: 71.215 kg (157 lb)    Exam:   General:  nad  Cardiovascular: rrr  Respiratory: ctab anterior lung fields  Abdomen:  Soft, nontender, nondistended, positive bowel sounds.  Musculoskeletal: No clubbing cyanosis or edema.  Data Reviewed: Basic Metabolic Panel:  Recent Labs Lab 05/22/14 0602 05/23/14 0635 05/24/14 0535 05/25/14 0545 05/26/14 0730 05/27/14 0519  NA 145 150* 149* 147 146 146  K 3.9 4.1 3.5* 3.8 3.9 3.6*  CL 107 114* 111 108 109 109  CO2 17* 16* 22 21 22 21   GLUCOSE 131* 107* 117* 100* 92 102*  BUN 67* 69* 67* 61* 50* 47*  CREATININE 4.78* 4.67* 4.37* 4.02* 3.46* 3.44*  CALCIUM 9.2 9.6 9.2 10.0 9.5 9.6  MG 2.4  --   --   --   --   --   PHOS 5.6* 6.2*  --  5.1*  --   --    Liver Function Tests:  Recent Labs Lab 05/21/14 0540 05/22/14 0602 05/23/14 0635 05/25/14 0545  AST 46* 44*  --   --   ALT 60* 58*  --   --   ALKPHOS 74 102  --   --   BILITOT 1.2 0.7  --   --   PROT 7.4 6.9  --   --   ALBUMIN 3.3* 2.8* 2.6* 2.4*   No results for input(s): LIPASE, AMYLASE in the last 168 hours.  Recent Labs Lab 05/20/14 1803  AMMONIA 36   CBC:  Recent Labs Lab 05/21/14 0540 05/22/14 0602 05/23/14 0635 05/24/14 0535 05/25/14 0545  WBC 15.9* 13.0* 13.6* 11.8* 11.3*  NEUTROABS 13.5* 11.1*  --   --   --   HGB 13.4 12.7* 13.0 12.8* 12.4*  HCT 42.0 39.7 39.8 39.6 38.0*  MCV 94.0 92.8 93.2 93.4 95.2  PLT 195 183 182 185 204   Cardiac Enzymes: No results for input(s): CKTOTAL, CKMB, CKMBINDEX, TROPONINI in the last 168 hours. BNP (last 3 results) No results for input(s): PROBNP in the last 8760  hours. CBG: No results for input(s): GLUCAP in the last 168 hours.  Recent Results (from the past 240 hour(s))  Culture, Urine     Status: None   Collection Time: 05/22/14 11:28 AM  Result Value Ref Range Status   Specimen Description URINE, CATHETERIZED  Final   Special Requests NONE  Final   Culture  Setup Time   Final    05/22/2014 12:29 Performed at [facility]    Colony Count NO GROWTH Performed at [facility]   Final   Culture NO GROWTH Performed at [facility]   Final   Report Status 05/23/2014 FINAL  Final     Studies: No results found.  Scheduled Meds: . antiseptic oral rinse  7 mL Mouth Rinse BID  . divalproex  250 mg Oral Daily  . donepezil  10 mg Oral Daily  . finasteride  5 mg Oral Daily  . heparin  5,000 Units Subcutaneous 3 times per day  . levothyroxine  50 mcg Oral QAC breakfast  . mirabegron ER  25 mg Oral Daily  . ondansetron (ZOFRAN) IV  4 mg Intravenous 4 times per day  . pantoprazole  40 mg Oral Q breakfast  . sodium bicarbonate  650 mg Oral TID  . trihexyphenidyl  2 mg Oral Daily   Continuous Infusions:    Principal Problem:   AKI (acute kidney injury) Active Problems:   Cerebral artery occlusion with cerebral infarction   Secondary parkinsonism   High cholesterol   Bipolar disorder   Hypothyroidism   Dementia   Uncontrollable nausea and vomiting   Pyelonephritis   Nausea & vomiting   Esophageal obstruction due to food impaction   Duodenal ulcer: per EGD 21/1/15   Ulcerative esophagitis: where food impaction was per EGD 05/21/14   Leukocytosis   UTI (urinary tract infection)   Hypernatremia   Nausea and vomiting    Time spent: 40 mins    [facility] CenterHOMPSON,DANIEL MD [facility] Pager [phone]. If 7PM-7AM, please contact night-coverage at [email or web address], password [email or web address] 05/27/2014, 10:26 AM  LOS: 9 days

## 2014-05-27 NOTE — Progress Notes (Signed)
Admit: 05/18/2014 LOS: 9  3M AoCKD3 2/2 ATN and N/V related to esophageal impaction.  Has had mild hyernatremia likely at least partially NDI related to hx/o Fernando Ruiz use.   Subjective:  Feels well, No c/o or needs  Off IVFs  12/06 0701 - 12/07 0700 In: 1507.5 [P.O.:960; I.V.:547.5] Out: 1400 [Urine:1400]  Filed Weights   05/18/14 1119  Weight: 71.215 kg (157 lb)    Scheduled Meds: . antiseptic oral rinse  7 mL Mouth Rinse BID  . divalproex  250 mg Oral Daily  . donepezil  10 mg Oral Daily  . finasteride  5 mg Oral Daily  . heparin  5,000 Units Subcutaneous 3 times per day  . levothyroxine  50 mcg Oral QAC breakfast  . mirabegron ER  25 mg Oral Daily  . ondansetron (ZOFRAN) IV  4 mg Intravenous 4 times per day  . pantoprazole  40 mg Oral Q breakfast  . sodium bicarbonate  650 mg Oral TID  . trihexyphenidyl  2 mg Oral Daily   Continuous Infusions:  PRN Meds:.hydrALAZINE, LORazepam, [DISCONTINUED] ondansetron **OR** ondansetron (ZOFRAN) IV  Current Labs: reviewed    Physical Exam:  Blood pressure 122/77, pulse 68, temperature 98.9 F (37.2 C), temperature source Oral, resp. rate 18, height 5\' 8"  (1.727 m), weight 71.215 kg (157 lb), SpO2 95 %. NAD, in chair RRR CTAB No LEE No rashes/lesions Pleasant, AAO  Assessment/Plan 1. AoCKD 1. SCr cont to improve 2. UOP good 2. Hypernatermia: stable in past 24h 1. Off IVFs currently 2. ENcouraged PO in take 3. Might be able to try a thiazide 3. N/V and esophageal impaction; stable 4. BPAD, not on Li currently 5. HTN, s table 6. Hx/o CVA   Sabra Heckyan Tomio Kirk MD 05/27/2014, 7:27 PM   Recent Labs Lab 05/22/14 0602 05/23/14 0635  05/25/14 0545 05/26/14 0730 05/27/14 0519  NA 145 150*  < > 147 146 146  K 3.9 4.1  < > 3.8 3.9 3.6*  CL 107 114*  < > 108 109 109  CO2 17* 16*  < > 21 22 21   GLUCOSE 131* 107*  < > 100* 92 102*  BUN 67* 69*  < > 61* 50* 47*  CREATININE 4.78* 4.67*  < > 4.02* 3.46* 3.44*  CALCIUM 9.2 9.6  <  > 10.0 9.5 9.6  PHOS 5.6* 6.2*  --  5.1*  --   --   < > = values in this interval not displayed.  Recent Labs Lab 05/21/14 0540 05/22/14 0602 05/23/14 0635 05/24/14 0535 05/25/14 0545  WBC 15.9* 13.0* 13.6* 11.8* 11.3*  NEUTROABS 13.5* 11.1*  --   --   --   HGB 13.4 12.7* 13.0 12.8* 12.4*  HCT 42.0 39.7 39.8 39.6 38.0*  MCV 94.0 92.8 93.2 93.4 95.2  PLT 195 183 182 185 204

## 2014-05-27 NOTE — Plan of Care (Signed)
Problem: Phase II Progression Outcomes Goal: IV changed to normal saline lock Outcome: Completed/Met Date Met:  05/27/14 Goal: Obtain order to discontinue catheter if appropriate Outcome: Not Applicable Date Met:  65/48/68  Problem: Phase III Progression Outcomes Goal: Foley discontinued Outcome: Not Applicable Date Met:  85/20/74

## 2014-05-27 NOTE — Progress Notes (Signed)
Nutrition Brief Note  Patient identified on the Malnutrition Screening Tool (MST) Report  Wt Readings from Last 15 Encounters:  05/18/14 157 lb (71.215 kg)  11/02/13 155 lb (70.308 kg)  11/01/12 147 lb (66.679 kg)   Pt reports he feels very well today. States he is tolerating diet well. And he has a good appetite currently and at baseline. He denies weight loss. Noted wt gain trend over the past year. He denies any further nutrition questions or needs at this time.   Body mass index is 23.88 kg/(m^2). Patient meets criteria for normal weight range based on current BMI.   Current diet order is Soft, patient is consuming approximately 50-100% of meals at this time. Labs and medications reviewed.   No nutrition interventions warranted at this time. If nutrition issues arise, please consult RD.   Fernando Ruiz, RD, LDN Pager: [phone] After hours Pager: [phone]

## 2014-05-28 LAB — BASIC METABOLIC PANEL
Anion gap: 17 — ABNORMAL HIGH (ref 5–15)
BUN: 42 mg/dL — ABNORMAL HIGH (ref 6–23)
CHLORIDE: 110 meq/L (ref 96–112)
CO2: 20 meq/L (ref 19–32)
Calcium: 9.5 mg/dL (ref 8.4–10.5)
Creatinine, Ser: 3.45 mg/dL — ABNORMAL HIGH (ref 0.50–1.35)
GFR calc Af Amer: 18 mL/min — ABNORMAL LOW (ref >90)
GFR calc non Af Amer: 15 mL/min — ABNORMAL LOW (ref >90)
Glucose, Bld: 75 mg/dL (ref 70–99)
POTASSIUM: 4.5 meq/L (ref 3.7–5.3)
Sodium: 147 mEq/L (ref 137–147)

## 2014-05-28 MED ORDER — SODIUM BICARBONATE 650 MG PO TABS: 650.0000 mg | ORAL_TABLET | Freq: Three times a day (TID) | ORAL | Status: DC

## 2014-05-28 MED ORDER — CLONAZEPAM 1 MG PO TABS: 1.0000 mg | ORAL_TABLET | Freq: Every day | ORAL | Status: DC

## 2014-05-28 NOTE — Progress Notes (Signed)
Admit: 05/18/2014 LOS: 10  49M AoCKD3 2/2 ATN and N/V related to esophageal impaction.  Has had mild hyernatremia likely at least partially NDI related to hx/o Dierdre SearlesLi use.   Subjective:  Serum Na stable 1.5L in yesterday Stable GFR, BUN coming down No complaints, resting  12/07 0701 - 12/08 0700 In: 1480 [P.O.:1480] Out: 2200 [Urine:2200]  Filed Weights   05/18/14 1119  Weight: 71.215 kg (157 lb)    Scheduled Meds: . antiseptic oral rinse  7 mL Mouth Rinse BID  . divalproex  250 mg Oral Daily  . donepezil  10 mg Oral Daily  . finasteride  5 mg Oral Daily  . heparin  5,000 Units Subcutaneous 3 times per day  . levothyroxine  50 mcg Oral QAC breakfast  . mirabegron ER  25 mg Oral Daily  . ondansetron (ZOFRAN) IV  4 mg Intravenous 4 times per day  . pantoprazole  40 mg Oral Q breakfast  . sodium bicarbonate  650 mg Oral TID  . trihexyphenidyl  2 mg Oral Daily   Continuous Infusions:  PRN Meds:.hydrALAZINE, LORazepam, [DISCONTINUED] ondansetron **OR** ondansetron (ZOFRAN) IV  Current Labs: reviewed    Physical Exam:  Blood pressure 137/73, pulse 63, temperature 97.7 F (36.5 C), temperature source Oral, resp. rate 18, height 5\' 8"  (1.727 m), weight 71.215 kg (157 lb), SpO2 94 %. NAD, in chair RRR CTAB No LEE No rashes/lesions Pleasant, AAO  Assessment/Plan 1. AoCKD 1. SCr is stable 2. UOP excellent (? NDI) 2. Hypernatermia: Stable for first 24h off of IVF 1. Encouraged PO intake 2. Follow for now 3. No need for IVFs 3. N/V and esophageal impaction; stable 4. BPAD, not on Li currently 5. HTN, s table 6. Hx/o CVA   Sabra Heckyan Lillyann Ahart MD 05/28/2014, 12:30 PM   Recent Labs Lab 05/22/14 0602 05/23/14 0635  05/25/14 0545 05/26/14 0730 05/27/14 0519 05/28/14 0448  NA 145 150*  < > 147 146 146 147  K 3.9 4.1  < > 3.8 3.9 3.6* 4.5  CL 107 114*  < > 108 109 109 110  CO2 17* 16*  < > 21 22 21 20   GLUCOSE 131* 107*  < > 100* 92 102* 75  BUN 67* 69*  < > 61* 50* 47*  42*  CREATININE 4.78* 4.67*  < > 4.02* 3.46* 3.44* 3.45*  CALCIUM 9.2 9.6  < > 10.0 9.5 9.6 9.5  PHOS 5.6* 6.2*  --  5.1*  --   --   --   < > = values in this interval not displayed.  Recent Labs Lab 05/22/14 0602 05/23/14 0635 05/24/14 0535 05/25/14 0545  WBC 13.0* 13.6* 11.8* 11.3*  NEUTROABS 11.1*  --   --   --   HGB 12.7* 13.0 12.8* 12.4*  HCT 39.7 39.8 39.6 38.0*  MCV 92.8 93.2 93.4 95.2  PLT 183 182 185 204

## 2014-05-28 NOTE — Care Management Note (Signed)
    Page 1 of 1   05/28/2014     2:13:34 PM CARE MANAGEMENT NOTE 05/28/2014  Patient:  Fernando Ruiz,Fernando Ruiz   Account Number:  [id number]  Date Initiated:  05/21/2014  Documentation initiated by:  Letha CapeAYLOR,Branston Halsted  Subjective/Objective Assessment:   dx aki, diff swallowing  admit- lvies with spouse.     Action/Plan:   Anticipated DC Date:  05/28/2014   Anticipated DC Plan:  SKILLED NURSING FACILITY  In-house referral  Clinical Social Worker      DC Planning Services  CM consult      Choice offered to / List presented to:             Status of service:  Completed, signed off Medicare Important Message given?  YES (If response is "NO", the following Medicare IM given date fields will be blank) Date Medicare IM given:  05/21/2014 Medicare IM given by:  Letha CapeAYLOR,Cierra Rothgeb Date Additional Medicare IM given:  05/27/2014 Additional Medicare IM given by:  Letha CapeEBORAH Rishika Mccollom  Discharge Disposition:  SKILLED NURSING FACILITY  Per UR Regulation:  Reviewed for med. necessity/level of care/duration of stay  If discussed at Long Length of Stay Meetings, dates discussed:    Comments:

## 2014-05-28 NOTE — Discharge Summary (Signed)
Patient  being discharge to [facility]. Report called to nurse. Patient has no c/o of pain, no s/s of distress. Patient left hospital by EMS.

## 2014-05-28 NOTE — Discharge Summary (Signed)
Physician Discharge Summary  Donnal MoatJack W Hallgren RUE:[id number]:[id number] DOB: 08/28/1930 DOA: 05/18/2014  PCP: Hoyle SauerAVVA,[name], MD  Admit date: 05/18/2014 Discharge date: 05/28/2014  Time spent: 70 minutes  Recommendations for Outpatient Follow-up:  1. Patient be discharged to [facility] facility and will need to follow-up with M.D. at the skilled nursing facility. Patient will need a basic metabolic profile done on Thursday, 05/30/2014 to follow-up on his electrolytes and renal function. Patient needs to be encouraged to increase his fluid oral intake.  Discharge Diagnoses:  Principal Problem:   AKI (acute kidney injury) Active Problems:   Cerebral artery occlusion with cerebral infarction   Secondary parkinsonism   High cholesterol   Bipolar disorder   Hypothyroidism   Dementia   Uncontrollable nausea and vomiting   Pyelonephritis   Nausea & vomiting   Esophageal obstruction due to food impaction   Duodenal ulcer: per EGD 21/1/15   Ulcerative esophagitis: where food impaction was per EGD 05/21/14   Leukocytosis   UTI (urinary tract infection)   Hypernatremia   Nausea and vomiting   Discharge Condition: Stable and improved  Diet recommendation: Soft diet  Filed Weights   05/18/14 1119  Weight: 71.215 kg (157 lb)    History of present illness:  80 y/o known h/o Parkisnon's disease, apparent dementia with recent MMSe 23/30 by Neurology 10/2013 [stable], 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 He was in his usual state of affairs until 3 days prior to admission when he was not able to take any food or any liquids. He did not complain of any specific difficulty swallowing or pain on swallowing. He has not had any diarrhea he has been constipated for 3 or 4 days. He has no body aches no fever has not eaten any outside food has had no ill contacts denies any exotic  travel. He has had some stomach pain however according to his wife and his son. Patient denied overt chest pain or shortness of breath did not have any dysuria, passes urine regularly-but small amounts. He was a little bit confused at the bedside and couldnot tell admitting MD, any specific localization in terms of where he is, city and what year it was.  Patient had no recent changes of his medications other than recent increase slightly on his Aricept. It is noted that he has had a colonoscopy with removal of polyps about 4 years ago and an endoscopy with dilation of the EGD at the last.   Emergency room workup revealed ketones in the urine with mild protein urine hyaline casts CT abdomen and pelvis showed No acute abnormality small sliding hiatal hernia and nonobstructive cholelithiasis  sodium 154 chloride 114 BUN 42 creatinine 3.31, LFTs normal, 0.2 troponin negative next line  WBC 15.9 hemoglobin 13.9  Point-of-care troponin 0.3 EKG showed PR interval 0.0 QRS axis 60, PVC noted, no ST-T wave abnormalities across precordium   Hospital Course:  #1 acute on chronic kidney disease stage III Patient was noted during the hospitalization to go into acute on chronic kidney disease with his creatinine going up as high as 4.78. Likely secondary to volume depletion plus or minus mild ATN. Renal consultation was obtained patient was followed by nephrology during the hospitalization. Patient was placed on IV fluids with D5W and oral intake encouraged. Patient had good urine output. Patient was subsequently started on a diet after esophageal food disimpaction. Patient's renal function trended  down slowly, with good urine output. Patient's creatinine stabilized at 3.45 by day of discharge. Patient will need a basic metabolic profile done on 05/30/2014. Patient will follow-up with M.D. at skilled nursing facility.   #2 hypernatremia Patient was noted to be hypernatremic on admission, with a sodium  of 154. 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 Patient was hydrated with D5W and followed. Patient's hyponatremia improved and had resolved by day of discharge. Patient has been encouraged to increase his fluid oral intake and will need a basic metabolic profile done on Thursday, 05/30/2014.   #3 nausea and vomiting secondary to esophageal food impaction Patient had presented with nausea vomiting. During the workup GI consultation was obtained and patient was seen in consultation by Dr. Madilyn FiremanHayes. Patient subsequently underwent an upper endoscopy that showed esophageal food impaction with a carrot, which was removed. Patient swallowing improved. Patient did not have any further nausea or vomiting. Patient's diet was advanced to a soft diet which she tolerated. Patient was discharged in stable and improved condition.   #4 asymptomatic gallstones.  #5 leukocytosis Patient was noted to have a leukocytosis with admission. Workup showed possible UTI. Patient was placed empirically on IV Zosyn. Patient's leukocytosis trended down. Urine cultures however were negative and a such IV Zosyn was discontinued.   #6 bipolar disorder Continued on risperdal and clonazepam.  #7 BPH Continued on Proscar.  #8 hypertension Continued on Proscar.  #9 transaminitis Abdominal ultrasound with gallstones. LFTs trending down. No further workup at this time.  #10 history of CVA Stable.  #11 hypothyroidism Continued on Synthroid.  Procedures:  EGD 05/21/2014 per Dr. Evette CristalGanem  Chest x-ray 05/19/2014, 05/22/2014  Abdominal ultrasound 05/21/2014    Consultations:  Gastroenterology: Dr. Madilyn FiremanHayes 05/19/2014  Nephrology: Dr. Briant CedarMattingly 05/20/2014    Discharge Exam: Filed Vitals:   05/28/14 0542  BP: 137/73  Pulse: 63  Temp: 97.7 F (36.5 C)   Resp: 18    General: NAD Cardiovascular: RRR Respiratory: CTAB  Discharge Instructions You were cared for by a hospitalist during your hospital stay. If you have any questions about your discharge medications or the care you received while you were in the hospital after you are discharged, you can call the unit and asked to speak with the hospitalist on call if the hospitalist that took care of you is not available. Once you are discharged, your primary care physician will handle any further medical issues. Please note that NO REFILLS for any discharge medications will be authorized once you are discharged, as it is imperative that you return to your primary care physician (or establish a relationship with a primary care physician if you do not have one) for your aftercare needs so that they can reassess your need for medications and monitor your lab values.  Discharge Instructions    Diet general    Complete by:  As directed   Soft food/ mechanical soft. Please chop up food into small bits.     Discharge instructions    Complete by:  As directed   Follow up with MD at SNF. Patient needs BMET on Thursday 05/30/14.     Increase activity slowly    Complete by:  As directed           Current Discharge Medication List    START taking these medications   Details  sodium bicarbonate  650 MG tablet Take 1 tablet (650 mg total) by mouth 3 (three) times daily. Qty: 90 tablet, Refills: 0      CONTINUE these medications which have CHANGED   Details  clonazePAM (KLONOPIN) 1 MG tablet Take 1 tablet (1 mg total) by mouth daily. Qty: 20 tablet, Refills: 0      CONTINUE these medications which have NOT CHANGED   Details  alendronate (FOSAMAX) 70 MG tablet 70 mg once a week.     amLODipine (NORVASC) 5 MG tablet Take 5 mg by mouth daily.    divalproex (DEPAKOTE ER) 250 MG 24 hr tablet Take 250 mg by mouth daily.     donepezil (ARICEPT) 10 MG tablet Take 1 tablet (10 mg total) by mouth  daily. Qty: 90 tablet, Refills: 3    finasteride (PROSCAR) 5 MG tablet Take 5 mg by mouth daily.     levothyroxine (SYNTHROID, LEVOTHROID) 50 MCG tablet Take 50 mcg by mouth daily.     MYRBETRIQ 25 MG TB24 tablet Take 25 mg by mouth daily.     omeprazole (PRILOSEC) 40 MG capsule Take 40 mg by mouth daily.     risperiDONE (RISPERDAL) 1 MG tablet Take 1 mg by mouth daily.     simvastatin (ZOCOR) 40 MG tablet Take 40 mg by mouth daily.     trihexyphenidyl (ARTANE) 2 MG tablet Take 1 tablet (2 mg total) by mouth daily. Qty: 30 tablet, Refills: 11       Allergies  Allergen Reactions  . Penicillins Rash   Follow-up Information    Schedule an appointment as soon as possible for a visit in 1 week to follow up.   Why:  f/u with MD at SNF       The results of significant diagnostics from this hospitalization (including imaging, microbiology, ancillary and laboratory) are listed below for reference.    Significant Diagnostic Studies: Ct Abdomen Pelvis Wo Contrast  05/18/2014   CLINICAL DATA:  Constipation for the past 3 days. Nausea and vomiting for the past 2 days. Worsening abdominal pain for the past 2 days.  EXAM: CT ABDOMEN AND PELVIS WITHOUT CONTRAST  TECHNIQUE: Multidetector CT imaging of the abdomen and pelvis was performed following the standard protocol without IV contrast.  COMPARISON:  None.  FINDINGS: Small sliding hiatal hernia. Calcified granulomata in the liver. Unremarkable non contrasted appearance of the spleen, pancreas, adrenal glands, urinary bladder and prostate gland.  1.2 cm gallstone in the gallbladder. No gallbladder wall thickening or pericholecystic fluid. 1.2 cm mid left renal cyst or calyceal diverticulum with dependent calcific density. 6 mm hemorrhagic lower pole left renal cyst. Both kidneys are somewhat small.  Scattered colonic diverticula without evidence of diverticulitis. No enlarged lymph nodes. Atheromatous arterial calcifications. Normal amount of  stool in the colon. Mild bilateral lower lobe cylindrical bronchiectasis. Lumbar and lower thoracic spine degenerative changes and mild scoliosis.  IMPRESSION: 1. No acute abnormality. 2. Small sliding hiatal hernia. 3. Cholelithiasis. 4. Mild bilateral lower lobe cylindrical bronchiectasis.   Electronically Signed   By: Gordan Payment M.D.   On: 05/18/2014 14:08   Dg Chest 2 View  05/19/2014   CLINICAL DATA:  Pneumonia  EXAM: CHEST  2 VIEW  COMPARISON:  01/31/2013  FINDINGS: Cardiomediastinal silhouette is stable. No acute infiltrate or pleural effusion. No pulmonary edema. Osteopenia and mild degenerative changes mid thoracic spine. Atherosclerotic calcifications of thoracic aorta.  IMPRESSION: No active cardiopulmonary disease.   Electronically Signed   By: Natasha Mead  M.D.   On: 05/19/2014 16:44   US Abdomen Complete  05/21/2014   CLINICAL DATA:  History of gallstones ; dialysis dependent chronic renal failure  EXAM: ULTRASOUND ABDOMEN COMPLETE  COMPARISON:  CT scan of the abdomen and pelvis of May 18 2014  FINDINGS: Gallbladder: The gallbladder is adequately distended. There is a 11 mm diameter mobile mobile echogenic shadowing stone. There is no gallbladder wall thickening, pericholecystic fluid, or positive sonographic Murphy's sign.  Common bile duct: Diameter: 4.3 mm  Liver: No focal lesion identified. Within normal limits in parenchymal echogenicity.  IVC: No abnormality visualized.  Pancreas: Visualized portion unremarkable.  Spleen: Size and appearance within normal limits.  Right Kidney: Length: 10.5 cm. There is cortical thinning diffusely with increased cortical echotexture.  Left Kidney: Length: 10 cm. There is cortical thinning diffusely and increased cortical echotexture. There is a 1.9 cm simple appearing upper pole cyst.  Abdominal aorta: No aneurysm visualized.  Other findings: None.  IMPRESSION: 1. Cholelithiasis without evidence of acute cholecystitis. 2. Changes within the kidneys  consistent with chronic renal failure.   Electronically Signed   By: David  [place]   On: 05/21/2014 08:13   Dg Esophagus  05/20/2014   CLINICAL DATA:  Vomiting.  Dysphagia.  ICD10: R 13.1.  Dementia.  EXAM: ESOPHAGUS/BARIUM SWALLOW/TABLET STUDY  COMPARISON:  Chest radiograph of 05/19/2014. Abdominal pelvic CT of 05/18/2014.  FINDINGS: Exam could not be performed. Patient was somnolent, and only mildly arousable. Attempt was made to have the patient swallow contrast, without success.  IMPRESSION: Unsuccessful attempt at esophagram. Given the clinical history of dementia and dysphasia, recommend speech pathology evaluation prior to attempting repeat esophagram. Esophagram should also be withheld until the patient can follow directions.   Electronically Signed   By: Jeronimo Greaves M.D.   On: 05/20/2014 16:05   Dg Chest Port 1 View  05/22/2014   CLINICAL DATA:  Leukocytosis.  Weakness.  EXAM: PORTABLE CHEST - 1 VIEW  COMPARISON:  05/19/2014.  01/31/2013.  FINDINGS: Low volume chest. Probable superimposed interstitial pulmonary edema. Basilar atelectasis. Tortuous or ectatic thoracic aortic arch with atherosclerosis. Cardiopericardial silhouette is probably unchanged allowing for lower lung volumes.  No gross focal consolidation. Probable calcified lymph node in the RIGHT hilum.  IMPRESSION: Low volume chest with probable interstitial pulmonary edema.   Electronically Signed   By: Andreas Newport M.D.   On: 05/22/2014 08:53    Microbiology: Recent Results (from the past 240 hour(s))  Culture, Urine     Status: None   Collection Time: 05/22/14 11:28 AM  Result Value Ref Range Status   Specimen Description URINE, CATHETERIZED  Final   Special Requests NONE  Final   Culture  Setup Time   Final    05/22/2014 12:29 Performed at [facility]    Colony Count NO GROWTH Performed at [facility]   Final   Culture NO GROWTH Performed at [facility]   Final   Report Status  05/23/2014 FINAL  Final     Labs: Basic Metabolic Panel:  Recent Labs Lab 05/22/14 0602 05/23/14 0635 05/24/14 0535 05/25/14 0545 05/26/14 0730 05/27/14 0519 05/28/14 0448  NA 145 150* 149* 147 146 146 147  K 3.9 4.1 3.5* 3.8 3.9 3.6* 4.5  CL 107 114* 111 108 109 109 110  CO2 17* 16* 22 21 22 21 20   GLUCOSE 131* 107* 117* 100* 92 102* 75  BUN 67* 69* 67* 61* 50* 47* 42*  CREATININE 4.78* 4.67* 4.37*  4.02* 3.46* 3.44* 3.45*  CALCIUM 9.2 9.6 9.2 10.0 9.5 9.6 9.5  MG 2.4  --   --   --   --   --   --   PHOS 5.6* 6.2*  --  5.1*  --   --   --    Liver Function Tests:  Recent Labs Lab 05/22/14 0602 05/23/14 0635 05/25/14 0545  AST 44*  --   --   ALT 58*  --   --   ALKPHOS 102  --   --   BILITOT 0.7  --   --   PROT 6.9  --   --   ALBUMIN 2.8* 2.6* 2.4*   No results for input(s): LIPASE, AMYLASE in the last 168 hours. No results for input(s): AMMONIA in the last 168 hours. CBC:  Recent Labs Lab 05/22/14 0602 05/23/14 0635 05/24/14 0535 05/25/14 0545  WBC 13.0* 13.6* 11.8* 11.3*  NEUTROABS 11.1*  --   --   --   HGB 12.7* 13.0 12.8* 12.4*  HCT 39.7 39.8 39.6 38.0*  MCV 92.8 93.2 93.4 95.2  PLT 183 182 185 204   Cardiac Enzymes: No results for input(s): CKTOTAL, CKMB, CKMBINDEX, TROPONINI in the last 168 hours. BNP: BNP (last 3 results) No results for input(s): PROBNP in the last 8760 hours. CBG: No results for input(s): GLUCAP in the last 168 hours.     SignedRamiro Harvest MD [facility] 05/28/2014, 1:34 PM

## 2014-05-28 NOTE — Clinical Social Work Placement (Signed)
Clinical Social Work Department CLINICAL SOCIAL WORK PLACEMENT NOTE 05/28/2014  Patient:  Fernando MoatROBERTS,[name]  Account Number:  [id number] Admit date:  05/18/2014  Clinical Social Worker:  Robin SearingJANET CALDWELL, LCSWA  Date/time:  05/22/2014 12:44 PM  Clinical Social Work is seeking post-discharge placement for this patient at the following level of care:   SKILLED NURSING   (*CSW will update this form in Epic as items are completed)   05/22/2014  Patient/family provided with [facility] of Clinical Social Work's list of facilities offering this level of care within the geographic area requested by the patient (or if unable, by the patient's family).  05/22/2014  Patient/family informed of their freedom to choose among providers that offer the needed level of care, that participate in Medicare, Medicaid or managed care program needed by the patient, have an available bed and are willing to accept the patient.  05/22/2014  Patient/family informed of [facility]' ownership interest in [facility], as well as of the fact that they are under no obligation to receive care at this facility.  PASARR submitted to EDS on 05/22/2014 PASARR number received on   FL2 transmitted to all facilities in geographic area requested by pt/family on  05/22/2014 FL2 transmitted to all facilities within larger geographic area on   Patient informed that his/her managed care company has contracts with or will negotiate with  certain facilities, including the following:     Patient/family informed of bed offers received:  05/28/2014 [facility] Physician recommends and patient chooses bed at    [facility]  05/28/2014 Patient to be transferred to facility by [facility] Patient and family notified of transfer on 05/28/2014 Name of family member notified:  Vena AustriaEleanor  The following physician request  were entered in Epic:   Additional Comments:   Per MD patient ready for DC to [facility]. RN, patient, patient's family, and facility notified of DC. RN given number for report. DC packet on chart. [facility] transport requested for patient (service request ID: [id number]). CSW signing off.    Roddie McBryant Zeanna Sunde MSW, [place]WA, [place], [phone]

## 2014-05-31 ENCOUNTER — Emergency Department (HOSPITAL_COMMUNITY): Payer: Medicare Other

## 2014-05-31 ENCOUNTER — Inpatient Hospital Stay (HOSPITAL_COMMUNITY)
Admission: EM | Admit: 2014-05-31 | Discharge: 2014-06-08 | DRG: 683 | Disposition: A | Discharge status: Skilled Nursing Facility | Payer: Medicare Other | Attending: Internal Medicine | Admitting: Internal Medicine

## 2014-05-31 ENCOUNTER — Encounter (HOSPITAL_COMMUNITY): Payer: Self-pay | Admitting: *Deleted

## 2014-05-31 DIAGNOSIS — E875 Hyperkalemia: Secondary | ICD-10-CM | POA: Diagnosis present

## 2014-05-31 DIAGNOSIS — E860 Dehydration: Secondary | ICD-10-CM | POA: Diagnosis present

## 2014-05-31 DIAGNOSIS — E878 Other disorders of electrolyte and fluid balance, not elsewhere classified: Secondary | ICD-10-CM | POA: Diagnosis present

## 2014-05-31 DIAGNOSIS — Z8673 Personal history of transient ischemic attack (TIA), and cerebral infarction without residual deficits: Secondary | ICD-10-CM

## 2014-05-31 DIAGNOSIS — R109 Unspecified abdominal pain: Secondary | ICD-10-CM

## 2014-05-31 DIAGNOSIS — N179 Acute kidney failure, unspecified: Principal | ICD-10-CM | POA: Diagnosis present

## 2014-05-31 DIAGNOSIS — I129 Hypertensive chronic kidney disease with stage 1 through stage 4 chronic kidney disease, or unspecified chronic kidney disease: Secondary | ICD-10-CM | POA: Diagnosis present

## 2014-05-31 DIAGNOSIS — G20 Parkinson's disease: Secondary | ICD-10-CM | POA: Diagnosis present

## 2014-05-31 DIAGNOSIS — E039 Hypothyroidism, unspecified: Secondary | ICD-10-CM | POA: Diagnosis present

## 2014-05-31 DIAGNOSIS — Z66 Do not resuscitate: Secondary | ICD-10-CM | POA: Diagnosis present

## 2014-05-31 DIAGNOSIS — Z79899 Other long term (current) drug therapy: Secondary | ICD-10-CM

## 2014-05-31 DIAGNOSIS — F319 Bipolar disorder, unspecified: Secondary | ICD-10-CM | POA: Diagnosis present

## 2014-05-31 DIAGNOSIS — G219 Secondary parkinsonism, unspecified: Secondary | ICD-10-CM | POA: Diagnosis present

## 2014-05-31 DIAGNOSIS — Z515 Encounter for palliative care: Secondary | ICD-10-CM | POA: Diagnosis not present

## 2014-05-31 DIAGNOSIS — R627 Adult failure to thrive: Secondary | ICD-10-CM | POA: Diagnosis present

## 2014-05-31 DIAGNOSIS — E870 Hyperosmolality and hypernatremia: Secondary | ICD-10-CM | POA: Diagnosis present

## 2014-05-31 DIAGNOSIS — N189 Chronic kidney disease, unspecified: Secondary | ICD-10-CM | POA: Diagnosis present

## 2014-05-31 DIAGNOSIS — Z7401 Bed confinement status: Secondary | ICD-10-CM | POA: Diagnosis not present

## 2014-05-31 DIAGNOSIS — F0390 Unspecified dementia without behavioral disturbance: Secondary | ICD-10-CM | POA: Diagnosis present

## 2014-05-31 LAB — COMPREHENSIVE METABOLIC PANEL
ALBUMIN: 2.9 g/dL — AB (ref 3.5–5.2)
ALT: 52 U/L (ref 0–53)
ANION GAP: 20 — AB (ref 5–15)
AST: 29 U/L (ref 0–37)
Alkaline Phosphatase: 88 U/L (ref 39–117)
BUN: 53 mg/dL — AB (ref 6–23)
CO2: 18 mEq/L — ABNORMAL LOW (ref 19–32)
CREATININE: 3.74 mg/dL — AB (ref 0.50–1.35)
Calcium: 9.4 mg/dL (ref 8.4–10.5)
Chloride: 107 mEq/L (ref 96–112)
GFR calc Af Amer: 16 mL/min — ABNORMAL LOW (ref >90)
GFR calc non Af Amer: 14 mL/min — ABNORMAL LOW (ref >90)
Glucose, Bld: 95 mg/dL (ref 70–99)
Potassium: 5.4 mEq/L — ABNORMAL HIGH (ref 3.7–5.3)
Sodium: 145 mEq/L (ref 137–147)
TOTAL PROTEIN: 7.1 g/dL (ref 6.0–8.3)
Total Bilirubin: 0.4 mg/dL (ref 0.3–1.2)

## 2014-05-31 LAB — GRAM STAIN

## 2014-05-31 LAB — URINALYSIS, ROUTINE W REFLEX MICROSCOPIC
Bilirubin Urine: NEGATIVE
Glucose, UA: NEGATIVE mg/dL
Hgb urine dipstick: NEGATIVE
Ketones, ur: NEGATIVE mg/dL
Leukocytes, UA: NEGATIVE
NITRITE: NEGATIVE
Protein, ur: NEGATIVE mg/dL
SPECIFIC GRAVITY, URINE: 1.009 (ref 1.005–1.030)
UROBILINOGEN UA: 0.2 mg/dL (ref 0.0–1.0)
pH: 6 (ref 5.0–8.0)

## 2014-05-31 LAB — CBC WITH DIFFERENTIAL/PLATELET
BASOS ABS: 0 10*3/uL (ref 0.0–0.1)
Basophils Relative: 0 % (ref 0–1)
EOS ABS: 0.1 10*3/uL (ref 0.0–0.7)
Eosinophils Relative: 1 % (ref 0–5)
HCT: 39.2 % (ref 39.0–52.0)
Hemoglobin: 12.7 g/dL — ABNORMAL LOW (ref 13.0–17.0)
Lymphocytes Relative: 6 % — ABNORMAL LOW (ref 12–46)
Lymphs Abs: 0.6 10*3/uL — ABNORMAL LOW (ref 0.7–4.0)
MCH: 30.8 pg (ref 26.0–34.0)
MCHC: 32.4 g/dL (ref 30.0–36.0)
MCV: 95.1 fL (ref 78.0–100.0)
MONOS PCT: 6 % (ref 3–12)
Monocytes Absolute: 0.6 10*3/uL (ref 0.1–1.0)
NEUTROS PCT: 87 % — AB (ref 43–77)
Neutro Abs: 8.6 10*3/uL — ABNORMAL HIGH (ref 1.7–7.7)
Platelets: 240 10*3/uL (ref 150–400)
RBC: 4.12 MIL/uL — ABNORMAL LOW (ref 4.22–5.81)
RDW: 14.1 % (ref 11.5–15.5)
WBC: 10 10*3/uL (ref 4.0–10.5)

## 2014-05-31 LAB — I-STAT CG4 LACTIC ACID, ED
LACTIC ACID, VENOUS: 2.14 mmol/L (ref 0.5–2.2)
Lactic Acid, Venous: 2.53 mmol/L — ABNORMAL HIGH (ref 0.5–2.2)

## 2014-05-31 LAB — CBG MONITORING, ED: GLUCOSE-CAPILLARY: 112 mg/dL — AB (ref 70–99)

## 2014-05-31 MED ORDER — LACTATED RINGERS IV BOLUS (SEPSIS)
1000.0000 mL | Freq: Once | INTRAVENOUS | Status: AC
  Administered 2014-05-31: 1000 mL via INTRAVENOUS

## 2014-05-31 MED ORDER — RISPERIDONE 1 MG PO TABS
1.0000 mg | ORAL_TABLET | Freq: Every day | ORAL | Status: DC
  Administered 2014-06-01 – 2014-06-08 (×8): 1 mg via ORAL
  Filled 2014-05-31 (×8): qty 1

## 2014-05-31 MED ORDER — DONEPEZIL HCL 10 MG PO TABS
10.0000 mg | ORAL_TABLET | Freq: Every day | ORAL | Status: DC
Start: 2014-06-01 — End: 2014-06-08
  Administered 2014-06-01 – 2014-06-08 (×8): 10 mg via ORAL
  Filled 2014-05-31 (×9): qty 1

## 2014-05-31 MED ORDER — MORPHINE SULFATE 2 MG/ML IJ SOLN: 2.0000 mg | INTRAMUSCULAR | Status: DC | PRN

## 2014-05-31 MED ORDER — ONDANSETRON HCL 4 MG/2ML IJ SOLN
4.0000 mg | Freq: Four times a day (QID) | INTRAMUSCULAR | Status: DC | PRN
  Filled 2014-05-31: qty 2

## 2014-05-31 MED ORDER — ONDANSETRON HCL 4 MG PO TABS: 4.0000 mg | ORAL_TABLET | Freq: Four times a day (QID) | ORAL | Status: DC | PRN

## 2014-05-31 MED ORDER — DIVALPROEX SODIUM ER 250 MG PO TB24
250.0000 mg | ORAL_TABLET | Freq: Every day | ORAL | Status: DC
  Administered 2014-06-01 – 2014-06-08 (×8): 250 mg via ORAL
  Filled 2014-05-31 (×8): qty 1

## 2014-05-31 MED ORDER — ACETAMINOPHEN 650 MG RE SUPP: 650.0000 mg | Freq: Four times a day (QID) | RECTAL | Status: DC | PRN

## 2014-05-31 MED ORDER — LORAZEPAM 2 MG/ML IJ SOLN: 0.5000 mg | Freq: Four times a day (QID) | INTRAMUSCULAR | Status: DC | PRN

## 2014-05-31 MED ORDER — HYDROCODONE-ACETAMINOPHEN 5-325 MG PO TABS: 1.0000 | ORAL_TABLET | ORAL | Status: DC | PRN

## 2014-05-31 MED ORDER — LEVOTHYROXINE SODIUM 100 MCG IV SOLR
25.0000 ug | Freq: Every day | INTRAVENOUS | Status: DC
  Administered 2014-06-01: 25 ug via INTRAVENOUS
  Filled 2014-05-31 (×2): qty 5

## 2014-05-31 MED ORDER — SODIUM CHLORIDE 0.9 % IV BOLUS (SEPSIS): 1000.0000 mL | INTRAVENOUS | Status: DC

## 2014-05-31 MED ORDER — ACETAMINOPHEN 325 MG PO TABS: 650.0000 mg | ORAL_TABLET | Freq: Four times a day (QID) | ORAL | Status: DC | PRN

## 2014-05-31 NOTE — ED Notes (Signed)
Attempted report 

## 2014-05-31 NOTE — Discharge Instructions (Signed)
Acute Kidney Injury °Acute kidney injury is a disease in which there is sudden (acute) damage to the kidneys. The kidneys are 2 organs that lie on either side of the spine between the middle of the back and the front of the abdomen. The kidneys: °· Remove wastes and extra water from the blood.   °· Produce important hormones. These help keep bones strong, regulate blood pressure, and help create red blood cells.   °· Balance the fluids and chemicals in the blood and tissues. °A small amount of kidney damage may not cause problems, but a large amount of damage may make it difficult or impossible for the kidneys to work the way they should. Acute kidney injury may develop into long-lasting (chronic) kidney disease. It may also develop into a life-threatening disease called end-stage kidney disease. Acute kidney injury can get worse very quickly, so it should be treated right away. Early treatment may prevent other kidney diseases from developing. ° °CAUSES  °· A problem with blood flow to the kidneys. This may be caused by:   °· Blood loss.   °· Heart disease.   °· Severe burns.   °· Liver disease. °· Direct damage to the kidneys. This may be caused by: °· Some medicines.   °· A kidney infection.   °· Poisoning or consuming toxic substances.   °· A surgical wound.   °· A blow to the kidney area.   °· A problem with urine flow. This may be caused by:   °· Cancer.   °· Kidney stones.   °· An enlarged prostate. °SYMPTOMS  °· Swelling (edema) of the legs, ankles, or feet.   °· Tiredness (lethargy).   °· Nausea or vomiting.   °· Confusion.   °· Problems with urination, such as:   °· Painful or burning feeling during urination.   °· Decreased urine production.   °· Frequent accidents in children who are potty trained.   °· Bloody urine.   °· Muscle twitches and cramps.   °· Shortness of breath.   °· Seizures.   °· Chest pain or pressure. °Sometimes, no symptoms are present.  °DIAGNOSIS °Acute kidney injury may be detected  and diagnosed by tests, including blood, urine, imaging, or kidney biopsy tests.  °TREATMENT °Treatment of acute kidney injury varies depending on the cause and severity of the kidney damage. In mild cases, no treatment may be needed. The kidneys may heal on their own. If acute kidney injury is more severe, your caregiver will treat the cause of the kidney damage, help the kidneys heal, and prevent complications from occurring. Severe cases may require a procedure to remove toxic wastes from the body (dialysis) or surgery to repair kidney damage. Surgery may involve:  °· Repair of a torn kidney.   °· Removal of an obstruction. °Most of the time, you will need to stay overnight at the hospital.  °HOME CARE INSTRUCTIONS: °· Follow your prescribed diet. °· Only take over-the-counter or prescription medicines as directed by your caregiver.  °· Do not take any new medicines (prescription, over-the-counter, or nutritional supplements) unless approved by your caregiver. Many medicines can worsen your kidney damage or need to have the dose adjusted.   °· Keep all follow-up appointments as directed by your caregiver. °· Observe your condition to make sure you are healing as expected. °SEEK IMMEDIATE MEDICAL CARE IF: °· You are feeling ill or have severe pain in the back or side.   °· Your symptoms return or you have new symptoms. °· You have any symptoms of end-stage kidney disease. These include:   °· Persistent itchiness.   °· Loss of appetite.   °· Headaches.   °· Abnormally dark   or light skin. °· Numbness in the hands or feet.   °· Easy bruising.   °· Frequent hiccups.   °· Menstruation stops.   °· You have a fever. °· You have increased urine production. °· You have pain or bleeding when urinating. °MAKE SURE YOU:  °· Understand these instructions. °· Will watch your condition. °· Will get help right away if you are not doing well or get worse °Document Released: 12/21/2010 Document Revised: 10/02/2012 Document  Reviewed: 02/04/2012 °Exit[employer]® Patient Information ©2015 [employer]. This information is not intended to replace advice given to you by your health care provider. Make sure you discuss any questions you have with your health care provider. ° °Dehydration, Adult °Dehydration is when you lose more fluids from the body than you take in. Vital organs like the kidneys, brain, and heart cannot function without a proper amount of fluids and salt. Any loss of fluids from the body can cause dehydration.  °CAUSES  °· Vomiting. °· Diarrhea. °· Excessive sweating. °· Excessive urine output. °· Fever. °SYMPTOMS  °Mild dehydration °· Thirst. °· Dry lips. °· Slightly dry mouth. °Moderate dehydration °· Very dry mouth. °· Sunken eyes. °· Skin does not bounce back quickly when lightly pinched and released. °· Dark urine and decreased urine production. °· Decreased tear production. °· Headache. °Severe dehydration °· Very dry mouth. °· Extreme thirst. °· Rapid, weak pulse (more than 100 beats per minute at rest). °· Cold hands and feet. °· Not able to sweat in spite of heat and temperature. °· Rapid breathing. °· Blue lips. °· Confusion and lethargy. °· Difficulty being awakened. °· Minimal urine production. °· No tears. °DIAGNOSIS  °Your caregiver will diagnose dehydration based on your symptoms and your exam. Blood and urine tests will help confirm the diagnosis. The diagnostic evaluation should also identify the cause of dehydration. °TREATMENT  °Treatment of mild or moderate dehydration can often be done at home by increasing the amount of fluids that you drink. It is best to drink small amounts of fluid more often. Drinking too much at one time can make vomiting worse. Refer to the home care instructions below. °Severe dehydration needs to be treated at the hospital where you will probably be given intravenous (IV) fluids that contain water and electrolytes. °HOME CARE INSTRUCTIONS  °· Ask your caregiver about specific  rehydration instructions. °· Drink enough fluids to keep your urine clear or pale yellow. °· Drink small amounts frequently if you have nausea and vomiting. °· Eat as you normally do. °· Avoid: °¨ Foods or drinks high in sugar. °¨ Carbonated drinks. °¨ Juice. °¨ Extremely hot or cold fluids. °¨ Drinks with caffeine. °¨ Fatty, greasy foods. °¨ Alcohol. °¨ Tobacco. °¨ Overeating. °¨ Gelatin desserts. °· Wash your hands well to avoid spreading bacteria and viruses. °· Only take over-the-counter or prescription medicines for pain, discomfort, or fever as directed by your caregiver. °· Ask your caregiver if you should continue all prescribed and over-the-counter medicines. °· Keep all follow-up appointments with your caregiver. °SEEK MEDICAL CARE IF: °· You have abdominal pain and it increases or stays in one area (localizes). °· You have a rash, stiff neck, or severe headache. °· You are irritable, sleepy, or difficult to awaken. °· You are weak, dizzy, or extremely thirsty. °SEEK IMMEDIATE MEDICAL CARE IF:  °· You are unable to keep fluids down or you get worse despite treatment. °· You have frequent episodes of vomiting or diarrhea. °· You have blood or green matter (bile) in your   vomit. °· You have blood in your stool or your stool looks black and tarry. °· You have not urinated in 6 to 8 hours, or you have only urinated a small amount of very dark urine. °· You have a fever. °· You faint. °MAKE SURE YOU:  °· Understand these instructions. °· Will watch your condition. °· Will get help right away if you are not doing well or get worse. °Document Released: 06/07/2005 Document Revised: 08/30/2011 Document Reviewed: 01/25/2011 °ExitCare® Patient Information ©2015 [employer]. This information is not intended to replace advice given to you by your health care provider. Make sure you discuss any questions you have with your health care provider. ° ° °

## 2014-05-31 NOTE — ED Notes (Signed)
Dr. Doutova at bedside.  

## 2014-05-31 NOTE — H&P (Addendum)
PCP: Hoyle SauerAVVA,[name], MD  [facility] CristalGanem have seen recently during admission  Chief Complaint:  Patient not eating, somnolent  HPI: Fernando Ruiz is a 80 y.o. male   has a past medical history of Resting tremor; Memory loss; Unspecified cerebral artery occlusion with cerebral infarction; Secondary parkinsonism; High cholesterol; Bipolar disorder; Hypothyroidism; and Dementia.   Presented with  Patient was admitted from 05/18/14 to 05/28/14 and was found to have dehydration and decreased PO intake due to esophageal obstruction with food. He has hx of Prior esophageal strictures. Sp dilatation in 2002. Patient undergone esophageal food disimpaction bu EDG showing some erosive gastritis and started to tolerate soft  diet. He was discharged to [facility]. After arrival to SNF he soon stopped eating again and became progressively lethargic and poorly responsive to family. On Arrival to ER Cr was noted to be up from 3.45 to 3.74 with rising K at 5.4 After extensive discussing with the family they would like the patient to have a palliative care consult and establishment with hospice. Family at this point would like patient to be comfort care only. They do not wish any aggressive interventions.  Hospitalist was called for admission for  Dehydration Acute on chronic renal failure and failure to thrive.   Review of Systems:  Unable to obtain as patient is unresponsive.  Past Medical History: Past Medical History  Diagnosis Date  . Resting tremor   . Memory loss   . Unspecified cerebral artery occlusion with cerebral infarction   . Secondary parkinsonism   . High cholesterol   . Bipolar disorder   . Hypothyroidism   . Dementia    Past Surgical History  Procedure Laterality Date  . None    . Esophagogastroduodenoscopy (egd) with propofol N/A 05/21/2014    Procedure: ESOPHAGOGASTRODUODENOSCOPY (EGD) WITH PROPOFOL with possible esophageal dilation;  Surgeon: Graylin ShiverSalem F  Ganem, MD;  [facility];  Service: Endoscopy;  Laterality: N/A;     Medications: Prior to Admission medications   Medication Sig Start Date End Date Taking? Authorizing Provider  amLODipine (NORVASC) 5 MG tablet Take 5 mg by mouth daily. 04/07/14  Yes Historical Provider, MD  clonazePAM (KLONOPIN) 1 MG tablet Take 1 tablet (1 mg total) by mouth daily. 05/28/14  Yes Rodolph Bonganiel Thompson V, MD  divalproex (DEPAKOTE ER) 250 MG 24 hr tablet Take 250 mg by mouth daily.  10/19/12  Yes Historical Provider, MD  donepezil (ARICEPT) 10 MG tablet Take 1 tablet (10 mg total) by mouth daily. 11/02/13  Yes Nilda RiggsNancy Carolyn Martin, NP  finasteride (PROSCAR) 5 MG tablet Take 5 mg by mouth daily.  10/03/12  Yes Historical Provider, MD  levothyroxine (SYNTHROID, LEVOTHROID) 50 MCG tablet Take 50 mcg by mouth daily.  10/18/12  Yes Historical Provider, MD  MYRBETRIQ 25 MG TB24 tablet Take 25 mg by mouth daily.  07/23/13  Yes Historical Provider, MD  omeprazole (PRILOSEC) 40 MG capsule Take 40 mg by mouth daily.  10/26/12  Yes Historical Provider, MD  risperiDONE (RISPERDAL) 1 MG tablet Take 1 mg by mouth daily.  07/25/12  Yes Historical Provider, MD  simvastatin (ZOCOR) 20 MG tablet Take 20 mg by mouth daily.   Yes Historical Provider, MD  sodium bicarbonate 650 MG tablet Take 1 tablet (650 mg total) by mouth 3 (three) times daily. 05/28/14  Yes Rodolph Bonganiel Thompson V, MD  trihexyphenidyl (ARTANE) 2 MG tablet Take 1 tablet (2 mg total) by mouth daily. 11/02/13  Yes Nilda RiggsNancy Carolyn Martin,  NP    Allergies:   Allergies  Allergen Reactions  . Penicillins Rash    Social History:  Ambulatory  , bed bound  From facility [facility]   reports that he has never smoked. He has never used smokeless tobacco. He reports that he does not drink alcohol or use illicit drugs.    Family History: family history includes Cancer in his brother and mother; Diabetes in his sister; Heart disease in his sister; Stroke in his father.     Physical Exam: Patient Vitals for the past 24 hrs:  BP Temp Temp src Pulse Resp SpO2  05/31/14 1809 108/68 mmHg - - 89 22 98 %  05/31/14 1727 105/57 mmHg 98.8 F (37.1 C) Rectal 96 22 95 %  05/31/14 1552 123/75 mmHg 97.7 F (36.5 C) Axillary - 20 96 %    1. General:  in No Acute distress, comfortable 2. Psychological: Somnolent not following commands 3. Head/ENT:   Dry Mucous Membranes                          Head Non traumatic, neck supple                          Poor Dentition 4. SKIN:  decreased Skin turgor,  Skin clean Dry and intact no rash 5. Heart: Regular rate and rhythm no Murmur, Rub or gallop 6. Lungs:   no wheezes or crackles, loud breathing prevents accurate exam 7. Abdomen: Soft, non-tender, Non distended 8. Lower extremities: no clubbing, cyanosis, or edema 9. Neurologically not able to assess 10. MSK: not able to assess  body mass index is unknown because there is no weight on file.   Labs on Admission:   Results for orders placed or performed during the hospital encounter of 05/31/14 (from the past 24 hour(s))  CBG monitoring, ED     Status: Abnormal   Collection Time: 05/31/14  4:01 PM  Result Value Ref Range   Glucose-Capillary 112 (H) 70 - 99 mg/dL  I-Stat CG4 Lactic Acid, ED     Status: None   Collection Time: 05/31/14  4:35 PM  Result Value Ref Range   Lactic Acid, Venous 2.14 0.5 - 2.2 mmol/L  Urinalysis, Routine w reflex microscopic     Status: None   Collection Time: 05/31/14  5:01 PM  Result Value Ref Range   Color, Urine YELLOW YELLOW   APPearance CLEAR CLEAR   Specific Gravity, Urine 1.009 1.005 - 1.030   pH 6.0 5.0 - 8.0   Glucose, UA NEGATIVE NEGATIVE mg/dL   Hgb urine dipstick NEGATIVE NEGATIVE   Bilirubin Urine NEGATIVE NEGATIVE   Ketones, ur NEGATIVE NEGATIVE mg/dL   Protein, ur NEGATIVE NEGATIVE mg/dL   Urobilinogen, UA 0.2 0.0 - 1.0 mg/dL   Nitrite NEGATIVE NEGATIVE   Leukocytes, UA NEGATIVE NEGATIVE  Gram stain      Status: None   Collection Time: 05/31/14  5:01 PM  Result Value Ref Range   Specimen Description URINE, CATHETERIZED    Special Requests NONE    Gram Stain CYTOSPIN PREP NO WBC SEEN NO ORGANISMS SEEN     Report Status 05/31/2014 FINAL   CBC WITH DIFFERENTIAL     Status: Abnormal   Collection Time: 05/31/14  5:11 PM  Result Value Ref Range   WBC 10.0 4.0 - 10.5 K/uL   RBC 4.12 (L) 4.22 - 5.81 MIL/uL   Hemoglobin 12.7 (L)  13.0 - 17.0 g/dL   HCT 47.8 29.5 - 62.1 %   MCV 95.1 78.0 - 100.0 fL   MCH 30.8 26.0 - 34.0 pg   MCHC 32.4 30.0 - 36.0 g/dL   RDW 30.8 65.7 - 84.6 %   Platelets 240 150 - 400 K/uL   Neutrophils Relative % 87 (H) 43 - 77 %   Neutro Abs 8.6 (H) 1.7 - 7.7 K/uL   Lymphocytes Relative 6 (L) 12 - 46 %   Lymphs Abs 0.6 (L) 0.7 - 4.0 K/uL   Monocytes Relative 6 3 - 12 %   Monocytes Absolute 0.6 0.1 - 1.0 K/uL   Eosinophils Relative 1 0 - 5 %   Eosinophils Absolute 0.1 0.0 - 0.7 K/uL   Basophils Relative 0 0 - 1 %   Basophils Absolute 0.0 0.0 - 0.1 K/uL  Comprehensive metabolic panel     Status: Abnormal   Collection Time: 05/31/14  5:11 PM  Result Value Ref Range   Sodium 145 137 - 147 mEq/L   Potassium 5.4 (H) 3.7 - 5.3 mEq/L   Chloride 107 96 - 112 mEq/L   CO2 18 (L) 19 - 32 mEq/L   Glucose, Bld 95 70 - 99 mg/dL   BUN 53 (H) 6 - 23 mg/dL   Creatinine, Ser 9.62 (H) 0.50 - 1.35 mg/dL   Calcium 9.4 8.4 - 95.2 mg/dL   Total Protein 7.1 6.0 - 8.3 g/dL   Albumin 2.9 (L) 3.5 - 5.2 g/dL   AST 29 0 - 37 U/L   ALT 52 0 - 53 U/L   Alkaline Phosphatase 88 39 - 117 U/L   Total Bilirubin 0.4 0.3 - 1.2 mg/dL   GFR calc non Af Amer 14 (L) >90 mL/min   GFR calc Af Amer 16 (L) >90 mL/min   Anion gap 20 (H) 5 - 15  I-Stat CG4 Lactic Acid, ED     Status: Abnormal   Collection Time: 05/31/14  7:05 PM  Result Value Ref Range   Lactic Acid, Venous 2.53 (H) 0.5 - 2.2 mmol/L    UA no evidence of UTI  No results found for: HGBA1C  Estimated Creatinine Clearance: 15 mL/min  (by C-G formula based on Cr of 3.74).  BNP (last 3 results) No results for input(s): PROBNP in the last 8760 hours.  Other results:  I have pearsonaly reviewed this: ECG REPORT  Rate:95  Rhythm: NSR ST&T Change: no ischemia   There were no vitals filed for this visit.   Cultures:    Component Value Date/Time   SDES URINE, CATHETERIZED 05/31/2014 1701   SPECREQUEST NONE 05/31/2014 1701   CULT NO GROWTH Performed at [facility]  05/22/2014 1128   REPTSTATUS 05/31/2014 FINAL 05/31/2014 1701     Radiological Exams on Admission: Dg Chest Port 1 View  05/31/2014   CLINICAL DATA:  Altered mental status and failure to thrive for 3 days.  EXAM: PORTABLE CHEST - 1 VIEW  COMPARISON:  05/22/2014  FINDINGS: The heart is borderline enlarged but stable. There is tortuosity and calcification of the thoracic aorta. Low lung volumes with vascular crowding and atelectasis. No definite infiltrates or effusions.  IMPRESSION: Low lung volumes with vascular crowding and atelectasis. No definite infiltrates or effusions.   Electronically Signed   By: Loralie Champagne M.D.   On: 05/31/2014 16:53    Chart has been reviewed  Assessment/Plan 80 yo M with hx of dementia, parkinson disease, esophageal strictures here with acute  on chronic renal failure and dehydration, failure to thrive per family comfort care only  Present on Admission:  . Dementia - chronic endstage of less than 6 months prognosis  . Failure to thrive in adult - likely secondary to chronic dementia patient has been refusing to eat  . Acute on chronic renal failure -in a setting of decreased by mouth intake  . Hyperkalemia - in the setting of history of chronic kidney disease. At this point, Comfort Care Only  . Dehydration - the family at this point wishes for patient to have comfort care only holding off on IV fluids   Prophylaxis: SCD , Protonix  CODE STATUS:   DNR/DNI, comfort care only hospice palliative care consult  in a.m. life expectancy days to weeks Other plan as per orders.  I have spent a total of 70 min on this admission extra time was taken to discuss goals of life, spend at least 30 min with the family in in depth discussion.   Cree Kunert 05/31/2014, 7:22 PM  [facility]  Pager [phone]   after 2 AM please page floor coverage PA If 7AM-7PM, please contact the day team taking care of the patient  [email or web address]  Password [email or web address]

## 2014-05-31 NOTE — ED Provider Notes (Signed)
CSN: [id number]     Arrival date & time 05/31/14  1537 History   First MD Initiated Contact with Patient 05/31/14 1558     Chief Complaint  Patient presents with  . Altered Mental Status     (Consider location/radiation/quality/duration/timing/severity/associated sxs/prior Treatment) HPI 80 year old male with multiple medical problems, recently admitted for failure to thrive, hypernatremia, malnutrition secondary to esophageal food bolus impaction on 12 1, presents with altered mental status. [facility] discharge from the hospital. The nursing home reports that the suspect he had an aspiration event sometime in the last 24-48 hours. They report that he has had poor oral intake for the last few days,, but over the past 24-48 hours has developed a slight cough, loud gurgling respirations, and increasing altered mental status. Wife provides history, she says that he has been less interactive, refusing to eat, and looking more pale over the course of the last day. He is nonverbal and unable to provide any further history.   Past Medical History  Diagnosis Date  . Resting tremor   . Memory loss   . Unspecified cerebral artery occlusion with cerebral infarction   . Secondary parkinsonism   . High cholesterol   . Bipolar disorder   . Hypothyroidism   . Dementia    Past Surgical History  Procedure Laterality Date  . None    . Esophagogastroduodenoscopy (egd) with propofol N/A 05/21/2014    Procedure: ESOPHAGOGASTRODUODENOSCOPY (EGD) WITH PROPOFOL with possible esophageal dilation;  Surgeon: Graylin ShiverSalem F Ganem, MD;  [facility];  Service: Endoscopy;  Laterality: N/A;   Family History  Problem Relation Age of Onset  . Stroke Father   . Cancer Mother   . Cancer Brother   . Heart disease Sister   . Diabetes Sister    History  Substance Use Topics  . Smoking status: Never Smoker   . Smokeless tobacco: Never Used  . Alcohol Use:  No    Review of Systems  Unable to perform ROS: Dementia      Allergies  Penicillins  Home Medications   Prior to Admission medications   Medication Sig Start Date End Date Taking? Authorizing Provider  amLODipine (NORVASC) 5 MG tablet Take 5 mg by mouth daily. 04/07/14  Yes Historical Provider, MD  clonazePAM (KLONOPIN) 1 MG tablet Take 1 tablet (1 mg total) by mouth daily. 05/28/14  Yes Rodolph Bonganiel Thompson V, MD  divalproex (DEPAKOTE ER) 250 MG 24 hr tablet Take 250 mg by mouth daily.  10/19/12  Yes Historical Provider, MD  donepezil (ARICEPT) 10 MG tablet Take 1 tablet (10 mg total) by mouth daily. 11/02/13  Yes Nilda RiggsNancy Carolyn Martin, NP  finasteride (PROSCAR) 5 MG tablet Take 5 mg by mouth daily.  10/03/12  Yes Historical Provider, MD  levothyroxine (SYNTHROID, LEVOTHROID) 50 MCG tablet Take 50 mcg by mouth daily.  10/18/12  Yes Historical Provider, MD  MYRBETRIQ 25 MG TB24 tablet Take 25 mg by mouth daily.  07/23/13  Yes Historical Provider, MD  omeprazole (PRILOSEC) 40 MG capsule Take 40 mg by mouth daily.  10/26/12  Yes Historical Provider, MD  risperiDONE (RISPERDAL) 1 MG tablet Take 1 mg by mouth daily.  07/25/12  Yes Historical Provider, MD  simvastatin (ZOCOR) 20 MG tablet Take 20 mg by mouth daily.   Yes Historical Provider, MD  sodium bicarbonate 650 MG tablet Take 1 tablet (650 mg total) by mouth 3 (three) times daily. 05/28/14  Yes Rodolph Bonganiel Thompson V,  MD  trihexyphenidyl (ARTANE) 2 MG tablet Take 1 tablet (2 mg total) by mouth daily. 11/02/13  Yes Nilda RiggsNancy Carolyn Martin, NP   BP 108/68 mmHg  Pulse 89  Temp(Src) 98.8 F (37.1 C) (Rectal)  Resp 22  SpO2 98% Physical Exam  Constitutional:  Elderly, cachectic, pale  HENT:  Head: Normocephalic and atraumatic.  Sunken eyes, edentulous  Eyes:  Does not track with eyes  Neck: No JVD present.  Cardiovascular: Normal rate and regular rhythm.   Diminished distal pulses  Pulmonary/Chest: Effort normal.  Rhonchi in lower lobes, worse on  right  Abdominal: Soft. There is tenderness. There is no guarding.  Musculoskeletal: He exhibits no edema.  Neurological: He is alert.  Nonverbal, localizes pain, opens eyes spontaneously  Skin: Skin is dry.  Extremities cool  Nursing note and vitals reviewed.   ED Course  Procedures (including critical care time) Labs Review Labs Reviewed  CBC WITH DIFFERENTIAL - Abnormal; Notable for the following:    RBC 4.12 (*)    Hemoglobin 12.7 (*)    Neutrophils Relative % 87 (*)    Neutro Abs 8.6 (*)    Lymphocytes Relative 6 (*)    Lymphs Abs 0.6 (*)    All other components within normal limits  COMPREHENSIVE METABOLIC PANEL - Abnormal; Notable for the following:    Potassium 5.4 (*)    CO2 18 (*)    BUN 53 (*)    Creatinine, Ser 3.74 (*)    Albumin 2.9 (*)    GFR calc non Af Amer 14 (*)    GFR calc Af Amer 16 (*)    Anion gap 20 (*)    All other components within normal limits  CBG MONITORING, ED - Abnormal; Notable for the following:    Glucose-Capillary 112 (*)    All other components within normal limits  I-STAT CG4 LACTIC ACID, ED - Abnormal; Notable for the following:    Lactic Acid, Venous 2.53 (*)    All other components within normal limits  GRAM STAIN  URINE CULTURE  URINALYSIS, ROUTINE W REFLEX MICROSCOPIC  I-STAT CG4 LACTIC ACID, ED    Imaging Review Dg Chest Port 1 View  05/31/2014   CLINICAL DATA:  Altered mental status and failure to thrive for 3 days.  EXAM: PORTABLE CHEST - 1 VIEW  COMPARISON:  05/22/2014  FINDINGS: The heart is borderline enlarged but stable. There is tortuosity and calcification of the thoracic aorta. Low lung volumes with vascular crowding and atelectasis. No definite infiltrates or effusions.  IMPRESSION: Low lung volumes with vascular crowding and atelectasis. No definite infiltrates or effusions.   Electronically Signed   By: Loralie ChampagneMark  Gallerani M.D.   On: 05/31/2014 16:53     EKG Interpretation None      MDM   Final diagnoses:   Abdominal pain  Dehydration  Acute on chronic kidney failure  Hyperkalemia   80 year old male presents with failure to thrive, dehydration, acute on chronic kidney failure and hyperkalemia. Patient appears weak and cachectic, cool extremities, but normotensive. Infectious etiologies considered investigated, but no evidence of urinary tract infection, no pneumonia, no significant abdominal tenderness, no leukocytosis. Unclear whether the patient has had a poor appetite or is been unable to swallow, will have nursing attempt to assess the patient's ability to swallow as he did have a recent food bolus impaction and erosive esophagitis. Chest x-ray does not show any free air concerning for esophageal rupture or perforation.  Discussed goals of care with the  family who would like everything done (intubation, CPR, admission, ABX) unless there was a poor prognosis for survival.   On review laboratory studies, patient has worsening renal function, does appear dehydrated, and has new hyperkalemia. We will plan to admit, hospitalist consulted.    After initial consultation with the hospitalist, the patient's family decided that they would rather take him back to the nursing facility and make him comfortable, and pursue hospice care. Questions were answered, family and agreement with plan.  Erskine Emery, MD 06/01/14 [id number]  Nelia Shi, MD 06/01/14 [phone]

## 2014-05-31 NOTE — ED Notes (Signed)
[facility]. Per EMS report pt failure to thrive since admit to NH. NH staff called and states pt was earlier feeding self and when staff went back to check on the patient found him with decreased loc. Pt only moaning when asked questions.

## 2014-06-01 DIAGNOSIS — F0390 Unspecified dementia without behavioral disturbance: Secondary | ICD-10-CM

## 2014-06-01 LAB — URINE CULTURE
COLONY COUNT: NO GROWTH
Culture: NO GROWTH

## 2014-06-01 MED ORDER — DEXTROSE-NACL 5-0.45 % IV SOLN
INTRAVENOUS | Status: DC
  Administered 2014-06-01 – 2014-06-05 (×4): via INTRAVENOUS

## 2014-06-01 NOTE — Progress Notes (Signed)
Nutrition Brief Note  Malnutrition Screening Tool result is inaccurate.  Please consult if nutrition needs are identified.  Brooklinn Longbottom F Tamakia Porto MS RD LDN Clinical Dietitian Pager:[phone]   

## 2014-06-01 NOTE — Progress Notes (Signed)
[facility] PROGRESS NOTE  Fernando MoatJack W Ruiz WUJ:[id number]:[id number] DOB: 08/07/1930 DOA: 05/31/2014 PCP: Hoyle SauerAVVA,[name], MD  Assessment/Plan: Active Problems:   Dementia - Endstage according the EMR. Most likely leading to failure to thrive. We'll continue to monitor patient in house and if death is not imminent then plan will be to transition to residential hospice - Discussed with wife who wishes for comfort care measures    Failure to thrive in adult - We'll place on maintenance IV fluids while in house - Principal problem currently - Comfort care measures - Disposition pending hospital course    Acute on chronic renal failure - Most likely secondary to prerenal causes given history of dehydration. We'll plan on rehydrating    Hyperkalemia - We'll place on IV fluids    Dehydration - We'll place on IV fluids most likely secondary to advanced dementia  Code Status: dnr Family Communication: discussed with spouse Disposition Plan: Pending hospital course   Consultants:  none  Procedures:  none  Antibiotics:  none  HPI/Subjective: Pt has no new complaints.   Objective: Filed Vitals:   06/01/14 0507  BP: 124/73  Pulse: 76  Temp: 97.6 F (36.4 C)  Resp: 16    Intake/Output Summary (Last 24 hours) at 06/01/14 1447 Last data filed at 06/01/14 0500  Gross per 24 hour  Intake   1000 ml  Output   1225 ml  Net   -225 ml   Filed Weights   05/31/14 2330  Weight: 66.225 kg (146 lb)    Exam:   General:  Pt in nad, alert and awake  Cardiovascular: rrr, no mrg  Respiratory: cta bl, no wheezes  Abdomen: soft, NT, ND  Musculoskeletal: no cyanosis or clubbing   Data Reviewed: Basic Metabolic Panel:  Recent Labs Lab 05/26/14 0730 05/27/14 0519 05/28/14 0448 05/31/14 1711  NA 146 146 147 145  K 3.9 3.6* 4.5 5.4*  CL 109 109 110 107  CO2 22 21 20  18*  GLUCOSE 92 102* 75 95  BUN 50* 47* 42* 53*  CREATININE 3.46* 3.44* 3.45* 3.74*  CALCIUM 9.5  9.6 9.5 9.4   Liver Function Tests:  Recent Labs Lab 05/31/14 1711  AST 29  ALT 52  ALKPHOS 88  BILITOT 0.4  PROT 7.1  ALBUMIN 2.9*   No results for input(s): LIPASE, AMYLASE in the last 168 hours. No results for input(s): AMMONIA in the last 168 hours. CBC:  Recent Labs Lab 05/31/14 1711  WBC 10.0  NEUTROABS 8.6*  HGB 12.7*  HCT 39.2  MCV 95.1  PLT 240   Cardiac Enzymes: No results for input(s): CKTOTAL, CKMB, CKMBINDEX, TROPONINI in the last 168 hours. BNP (last 3 results) No results for input(s): PROBNP in the last 8760 hours. CBG:  Recent Labs Lab 05/31/14 1601  GLUCAP 112*    Recent Results (from the past 240 hour(s))  Gram stain     Status: None   Collection Time: 05/31/14  5:01 PM  Result Value Ref Range Status   Specimen Description URINE, CATHETERIZED  Final   Special Requests NONE  Final   Gram Stain CYTOSPIN PREP NO WBC SEEN NO ORGANISMS SEEN   Final   Report Status 05/31/2014 FINAL  Final     Studies: Dg Abd 1 View  05/31/2014   CLINICAL DATA:  Failure to thrive.  Abdominal pain.  EXAM: ABDOMEN - 1 VIEW  COMPARISON:  05/18/2014  FINDINGS: Old healed bilateral rib fractures. Atherosclerosis noted. Bony demineralization.  Bowel gas pattern  within normal limits. Scattered vascular calcifications noted.  IMPRESSION: 1. Bowel gas pattern within normal limits. 2. Atherosclerosis and scattered vascular calcifications. 3. Bony demineralization.   Electronically Signed   By: Herbie BaltimoreWalt  Liebkemann M.D.   On: 05/31/2014 19:51   Dg Chest Port 1 View  05/31/2014   CLINICAL DATA:  Altered mental status and failure to thrive for 3 days.  EXAM: PORTABLE CHEST - 1 VIEW  COMPARISON:  05/22/2014  FINDINGS: The heart is borderline enlarged but stable. There is tortuosity and calcification of the thoracic aorta. Low lung volumes with vascular crowding and atelectasis. No definite infiltrates or effusions.  IMPRESSION: Low lung volumes with vascular crowding and  atelectasis. No definite infiltrates or effusions.   Electronically Signed   By: Loralie ChampagneMark  Gallerani M.D.   On: 05/31/2014 16:53    Scheduled Meds: . divalproex  250 mg Oral Daily  . donepezil  10 mg Oral Daily  . levothyroxine  25 mcg Intravenous Daily  . risperiDONE  1 mg Oral Daily   Continuous Infusions:    Time spent: > 35 minutes    [name], [name]  [facility] Pager [phone]  If 7PM-7AM, please contact night-coverage at [email or web address], password [email or web address] 06/01/2014, 2:47 PM  LOS: 1 day

## 2014-06-02 MED ORDER — LEVOTHYROXINE SODIUM 50 MCG PO TABS
50.0000 ug | ORAL_TABLET | Freq: Every day | ORAL | Status: DC
  Administered 2014-06-02 – 2014-06-08 (×7): 50 ug via ORAL
  Filled 2014-06-02 (×9): qty 1

## 2014-06-02 NOTE — Progress Notes (Signed)
[facility] PROGRESS NOTE  Donnal MoatJack W Mcgirr WUJ:[id number] DOB: 07/17/1930 DOA: 05/31/2014 PCP: Hoyle SauerAVVA,[name], MD  Assessment/Plan: Active Problems:   Dementia - Endstage according the EMR. Most likely leading to failure to thrive. We'll continue to monitor patient in house and if death is not imminent then plan will be to transition to residential hospice - Discussed with wife who wishes for comfort care measures    Failure to thrive in adult - We'll place on maintenance IV fluids while in house - Principal problem currently - Comfort care measures - Disposition pending hospital course: most likely d/c to residential hospice next am.    Acute on chronic renal failure - Most likely secondary to prerenal causes given history of dehydration. We'll plan on rehydrating    Hyperkalemia - We'll place on IV fluids    Dehydration - Improved with IV fluid rehydration - Discontinue fluids as patient is on soft diet  Code Status: dnr Family Communication: discussed with spouse Disposition Plan: Transition to Residential hospice next am   Consultants:  none  Procedures:  none  Antibiotics:  none  HPI/Subjective: Pt has no new complaints.   Objective: Filed Vitals:   06/02/14 1355  BP: 151/98  Pulse: 90  Temp: 98.5 F (36.9 C)  Resp: 16    Intake/Output Summary (Last 24 hours) at 06/02/14 1423 Last data filed at 06/02/14 0900  Gross per 24 hour  Intake    360 ml  Output   2100 ml  Net  -1740 ml   Filed Weights   05/31/14 2330  Weight: 66.225 kg (146 lb)    Exam:   General:  Pt in nad, alert and awake  Cardiovascular: rrr, no mrg  Respiratory: cta bl, no wheezes  Abdomen: soft, NT, ND  Musculoskeletal: no cyanosis or clubbing   Data Reviewed: Basic Metabolic Panel:  Recent Labs Lab 05/27/14 0519 05/28/14 0448 05/31/14 1711  NA 146 147 145  K 3.6* 4.5 5.4*  CL 109 110 107  CO2 21 20 18*  GLUCOSE 102* 75 95  BUN 47* 42* 53*   CREATININE 3.44* 3.45* 3.74*  CALCIUM 9.6 9.5 9.4   Liver Function Tests:  Recent Labs Lab 05/31/14 1711  AST 29  ALT 52  ALKPHOS 88  BILITOT 0.4  PROT 7.1  ALBUMIN 2.9*   No results for input(s): LIPASE, AMYLASE in the last 168 hours. No results for input(s): AMMONIA in the last 168 hours. CBC:  Recent Labs Lab 05/31/14 1711  WBC 10.0  NEUTROABS 8.6*  HGB 12.7*  HCT 39.2  MCV 95.1  PLT 240   Cardiac Enzymes: No results for input(s): CKTOTAL, CKMB, CKMBINDEX, TROPONINI in the last 168 hours. BNP (last 3 results) No results for input(s): PROBNP in the last 8760 hours. CBG:  Recent Labs Lab 05/31/14 1601  GLUCAP 112*    Recent Results (from the past 240 hour(s))  Urine culture     Status: None   Collection Time: 05/31/14  5:01 PM  Result Value Ref Range Status   Specimen Description URINE, CATHETERIZED  Final   Special Requests NONE  Final   Culture  Setup Time   Final    05/31/2014 17:25 Performed at [facility]    Colony Count NO GROWTH Performed at [facility]   Final   Culture NO GROWTH Performed at [facility]   Final   Report Status 06/01/2014 FINAL  Final  Gram stain     Status: None  Collection Time: 05/31/14  5:01 PM  Result Value Ref Range Status   Specimen Description URINE, CATHETERIZED  Final   Special Requests NONE  Final   Gram Stain CYTOSPIN PREP NO WBC SEEN NO ORGANISMS SEEN   Final   Report Status 05/31/2014 FINAL  Final     Studies: Dg Abd 1 View  05/31/2014   CLINICAL DATA:  Failure to thrive.  Abdominal pain.  EXAM: ABDOMEN - 1 VIEW  COMPARISON:  05/18/2014  FINDINGS: Old healed bilateral rib fractures. Atherosclerosis noted. Bony demineralization.  Bowel gas pattern within normal limits. Scattered vascular calcifications noted.  IMPRESSION: 1. Bowel gas pattern within normal limits. 2. Atherosclerosis and scattered vascular calcifications. 3. Bony demineralization.   Electronically Signed   By:  Herbie BaltimoreWalt  Liebkemann M.D.   On: 05/31/2014 19:51   Dg Chest Port 1 View  05/31/2014   CLINICAL DATA:  Altered mental status and failure to thrive for 3 days.  EXAM: PORTABLE CHEST - 1 VIEW  COMPARISON:  05/22/2014  FINDINGS: The heart is borderline enlarged but stable. There is tortuosity and calcification of the thoracic aorta. Low lung volumes with vascular crowding and atelectasis. No definite infiltrates or effusions.  IMPRESSION: Low lung volumes with vascular crowding and atelectasis. No definite infiltrates or effusions.   Electronically Signed   By: Loralie ChampagneMark  Gallerani M.D.   On: 05/31/2014 16:53    Scheduled Meds: . divalproex  250 mg Oral Daily  . donepezil  10 mg Oral Daily  . levothyroxine  50 mcg Oral QAC breakfast  . risperiDONE  1 mg Oral Daily   Continuous Infusions: . dextrose 5 % and 0.45% NaCl 75 mL/hr at 06/01/14 1516     Time spent: > 35 minutes    [name], Madison Albea  [facility] Pager [phone]  If 7PM-7AM, please contact night-coverage at [email or web address], password [email or web address] 06/02/2014, 2:23 PM  LOS: 2 days

## 2014-06-03 MED ORDER — AMLODIPINE BESYLATE 5 MG PO TABS
5.0000 mg | ORAL_TABLET | Freq: Every day | ORAL | Status: DC
  Administered 2014-06-03 – 2014-06-04 (×2): 5 mg via ORAL
  Filled 2014-06-03 (×3): qty 1

## 2014-06-03 NOTE — Care Management Note (Signed)
  Page 1 of 1   06/07/2014     1:51:37 PM CARE MANAGEMENT NOTE 06/07/2014  Patient:  Fernando Ruiz,Fernando Ruiz   Account Number:  [id number]  Date Initiated:  06/03/2014  Documentation initiated by:  Ronny FlurryWILE,Kamaljit Hizer  Subjective/Objective Assessment:     Action/Plan:   Anticipated DC Date:  06/07/2014   Anticipated DC Plan:  SKILLED NURSING FACILITY  In-house referral  Clinical Social Worker         Choice offered to / List presented to:             Status of service:  Completed, signed off Medicare Important Message given?  YES (If response is "NO", the following Medicare IM given date fields will be blank) Date Medicare IM given:  06/07/2014 Medicare IM given by:  Ronny FlurryWILE,Lothar Prehn Date Additional Medicare IM given:  06/05/2014 Additional Medicare IM given by:  Ronny FlurryHEATHER Selicia Windom  Discharge Disposition:    Per UR Regulation:    If discussed at Long Length of Stay Meetings, dates discussed:   06/06/2014    Comments:

## 2014-06-03 NOTE — Clinical Social Work Placement (Addendum)
Clinical Social Work Department CLINICAL SOCIAL WORK PLACEMENT NOTE 06/03/2014  Patient:  Fernando Ruiz,Fernando Ruiz  Account Number:  [id number] Admit date:  05/31/2014  Clinical Social Worker:  Fernando Ruiz, LCSWA  Date/time:  06/03/2014 12:17 PM  Clinical Social Work is seeking post-discharge placement for this patient at the following level of care:   SKILLED NURSING   (*CSW will update this form in Epic as items are completed)   06/03/2014  Patient/family provided with [facility] Department of Clinical Social Work's list of facilities offering this level of care within the geographic area requested by the patient (or if unable, by the patient's family).  06/03/2014  Patient/family informed of their freedom to choose among providers that offer the needed level of care, that participate in Medicare, Medicaid or managed care program needed by the patient, have an available bed and are willing to accept the patient.  06/03/2014  Patient/family informed of [facility]' ownership interest in [facility], as well as of the fact that they are under no obligation to receive care at this facility.  PASARR submitted to EDS on  PASARR number received on   FL2 transmitted to all facilities in geographic area requested by pt/family on  06/03/2014 FL2 transmitted to all facilities within larger geographic area on   Patient informed that his/her managed care company has contracts with or will negotiate with  certain facilities, including the following:     Patient/family informed of bed offers received:  06/04/2014 [facility] Physician recommends and patient chooses bed at    [facility]  [facility] on   Patient to be transferred to facility by PTAR Patient and family notified of transfer on  Name of family member notified:    The following physician request were entered in Epic:   Additional  Comments: PASARR previously existing.  [name], LCSWA [phone]) Licensed Clinical Social Worker Orthopedics [phone]) and Surgical [phone]([id number])

## 2014-06-03 NOTE — Progress Notes (Addendum)
[facility] PROGRESS NOTE  Fernando MoatJack W Ruiz QMV:[id number] DOB: 02/02/1931 DOA: 05/31/2014 PCP: Hoyle SauerAVVA,[name], MD  Assessment/Plan: Active Problems:   Dementia - Endstage according the EMR. Most likely leading to failure to thrive.  - Patient currently back at baseline and wife requesting transition to skilled nursing facility -We'll place PT evaluation orders    Failure to thrive in adult - Improved with IV fluid rehydration - SNF on discharge. Social worker aware    Acute on chronic renal failure - Most likely secondary to prerenal causes given history of dehydration. We'll plan on rehydrating - reassess bmp next am    Hyperkalemia - reassess next am - s/p IVF hydration.    Dehydration - Continue on soft diet  addendum Hypertension - Not well controlled - will add amlodipine.  Code Status: dnr Family Communication: discussed with spouse Disposition Plan: To SNF   Consultants:  none  Procedures:  none  Antibiotics:  none  HPI/Subjective: Pt has no new complaints.   Objective: Filed Vitals:   06/03/14 1300  BP: 173/98  Pulse: 102  Temp: 98.1 F (36.7 C)  Resp: 17    Intake/Output Summary (Last 24 hours) at 06/03/14 1617 Last data filed at 06/03/14 [id number]  Gross per 24 hour  Intake 2306.25 ml  Output    975 ml  Net 1331.25 ml   Filed Weights   05/31/14 2330  Weight: 66.225 kg (146 lb)    Exam:   General:  Pt in nad, alert and awake  Cardiovascular: rrr, no mrg  Respiratory: cta bl, no wheezes  Abdomen: soft, NT, ND  Musculoskeletal: no cyanosis or clubbing   Data Reviewed: Basic Metabolic Panel:  Recent Labs Lab 05/28/14 0448 05/31/14 1711  NA 147 145  K 4.5 5.4*  CL 110 107  CO2 20 18*  GLUCOSE 75 95  BUN 42* 53*  CREATININE 3.45* 3.74*  CALCIUM 9.5 9.4   Liver Function Tests:  Recent Labs Lab 05/31/14 1711  AST 29  ALT 52  ALKPHOS 88  BILITOT 0.4  PROT 7.1  ALBUMIN 2.9*   No results for input(s):  LIPASE, AMYLASE in the last 168 hours. No results for input(s): AMMONIA in the last 168 hours. CBC:  Recent Labs Lab 05/31/14 1711  WBC 10.0  NEUTROABS 8.6*  HGB 12.7*  HCT 39.2  MCV 95.1  PLT 240   Cardiac Enzymes: No results for input(s): CKTOTAL, CKMB, CKMBINDEX, TROPONINI in the last 168 hours. BNP (last 3 results) No results for input(s): PROBNP in the last 8760 hours. CBG:  Recent Labs Lab 05/31/14 1601  GLUCAP 112*    Recent Results (from the past 240 hour(s))  Urine culture     Status: None   Collection Time: 05/31/14  5:01 PM  Result Value Ref Range Status   Specimen Description URINE, CATHETERIZED  Final   Special Requests NONE  Final   Culture  Setup Time   Final    05/31/2014 17:25 Performed at [facility]    Colony Count NO GROWTH Performed at [facility]   Final   Culture NO GROWTH Performed at [facility]   Final   Report Status 06/01/2014 FINAL  Final  Gram stain     Status: None   Collection Time: 05/31/14  5:01 PM  Result Value Ref Range Status   Specimen Description URINE, CATHETERIZED  Final   Special Requests NONE  Final   Gram Stain CYTOSPIN PREP NO WBC SEEN NO ORGANISMS SEEN  Final   Report Status 05/31/2014 FINAL  Final     Studies: No results found.  Scheduled Meds: . amLODipine  5 mg Oral Daily  . divalproex  250 mg Oral Daily  . donepezil  10 mg Oral Daily  . levothyroxine  50 mcg Oral QAC breakfast  . risperiDONE  1 mg Oral Daily   Continuous Infusions: . dextrose 5 % and 0.45% NaCl 75 mL/hr at 06/02/14 1823     Time spent: > 35 minutes    Fernando Ruiz, Fernando Ruiz  [facility] Pager [phone]  If 7PM-7AM, please contact night-coverage at [email or web address], password [email or web address] 06/03/2014, 4:17 PM  LOS: 3 days

## 2014-06-03 NOTE — Clinical Social Work Psychosocial (Signed)
Clinical Social Work Department BRIEF PSYCHOSOCIAL ASSESSMENT 06/03/2014  Patient:  Fernando Ruiz,Fernando Ruiz     Account Number:  [id number]     Admit date:  05/31/2014  Clinical Social Worker:  Mosie EpsteinVAUGHN,Reta Norgren S, LCSWA  Date/Time:  06/03/2014 12:12 PM  Referred by:  Physician  Date Referred:  06/03/2014 Referred for  Residential hospice placement   Other Referral:   SNF placement.   Interview type:  Family Other interview type:   CSW spoke with pt's wife, Fernando Ruiz.    PSYCHOSOCIAL DATA Living Status:  FACILITY [facility] Level of care:  Skilled Nursing Facility Primary support name:  Fernando Ruiz Primary support relationship to patient:  SPOUSE Degree of support available:   Strong support system.    CURRENT CONCERNS Current Concerns  Post-Acute Placement   Other Concerns:   none.    SOCIAL WORK ASSESSMENT / PLAN CSW received referral for possible Residential Hospice placement at time of discharge. CSW attempted to meet with pt's wife at bedside to discuss discharge disposition, but pt's wife not present in hospital at the time. CSW spoke with pt's wife via phone. Per pt's wife, [facility], but pt's family requesting pt does not return at time of discharge. Pt's wife informed CSW pt's wife expecting pt to be discharged to a different SNF at time of discharge. Pt's wife requesting to visit SNF once bed offers received. CSW updated MD regarding change in discharge disposition.    CSW to continue to follow and assist with discharge planning needs.   Assessment/plan status:  Psychosocial Support/Ongoing Assessment of Needs Other assessment/ plan:   none.   Information/referral to community resources:   [facility]    PATIENT'S/FAMILY'S RESPONSE TO PLAN OF CARE: Pt's wife understanding and agreeable to CSW plan of care. Pt's wife expressed no  further questions or concerns at this time.       Fernando Ruiz, LCSWA [phone]) Licensed Clinical Social Worker Orthopedics [phone]([id number]) and Surgical [phone]([id number])

## 2014-06-03 NOTE — Clinical Documentation Improvement (Signed)
Please clarify nutritional status Supporting Information:  Braden Scale  Score 10-13  Activity -Bedfast Nutrition =Very poor Staff re-positions every 2 hours Risk factor: advanced dementia, resides SNF, hx malnutrition secondary to esophageal food bolus impaction   ED documents report that he has had poor oral intake for the last few days, presents with failure to thrive, dehydration,appears weak and cachectic, cool extremities BMI 23  Possible clinical condition to specify failure to thrive: . Severity --Mild (first degree) --Moderate (second degree) --Severe (third degree) . Avoid documenting a range of severity, such as "moderate to severe"  Thank You, Elpidio AnisGarnet Shakthi Scipio, RN, BSN, CDI [phone] [facility]

## 2014-06-04 LAB — BASIC METABOLIC PANEL
ANION GAP: 16 — AB (ref 5–15)
ANION GAP: 13 (ref 5–15)
Anion gap: 14 (ref 5–15)
BUN: 38 mg/dL — ABNORMAL HIGH (ref 6–23)
BUN: 38 mg/dL — ABNORMAL HIGH (ref 6–23)
BUN: 37 mg/dL — ABNORMAL HIGH (ref 6–23)
CHLORIDE: 124 meq/L — AB (ref 96–112)
CO2: 17 mEq/L — ABNORMAL LOW (ref 19–32)
CO2: 20 mEq/L (ref 19–32)
CO2: 19 mEq/L (ref 19–32)
CREATININE: 3.32 mg/dL — AB (ref 0.50–1.35)
CREATININE: 3.34 mg/dL — AB (ref 0.50–1.35)
Calcium: 9.3 mg/dL (ref 8.4–10.5)
Calcium: 9.2 mg/dL (ref 8.4–10.5)
Calcium: 9 mg/dL (ref 8.4–10.5)
Chloride: 126 mEq/L — ABNORMAL HIGH (ref 96–112)
Chloride: 125 mEq/L — ABNORMAL HIGH (ref 96–112)
Creatinine, Ser: 3.32 mg/dL — ABNORMAL HIGH (ref 0.50–1.35)
GFR calc non Af Amer: 16 mL/min — ABNORMAL LOW (ref >90)
GFR calc non Af Amer: 16 mL/min — ABNORMAL LOW (ref >90)
GFR, EST AFRICAN AMERICAN: 19 mL/min — AB (ref >90)
GFR, EST AFRICAN AMERICAN: 19 mL/min — AB (ref >90)
GFR, EST AFRICAN AMERICAN: 18 mL/min — AB (ref >90)
GFR, EST NON AFRICAN AMERICAN: 16 mL/min — AB (ref >90)
Glucose, Bld: 111 mg/dL — ABNORMAL HIGH (ref 70–99)
Glucose, Bld: 107 mg/dL — ABNORMAL HIGH (ref 70–99)
Glucose, Bld: 107 mg/dL — ABNORMAL HIGH (ref 70–99)
POTASSIUM: 4.9 meq/L (ref 3.7–5.3)
Potassium: 4.9 mEq/L (ref 3.7–5.3)
Potassium: 5.1 mEq/L (ref 3.7–5.3)
Sodium: 157 mEq/L — ABNORMAL HIGH (ref 137–147)
Sodium: 159 mEq/L — ABNORMAL HIGH (ref 137–147)
Sodium: 158 mEq/L — ABNORMAL HIGH (ref 137–147)

## 2014-06-04 MED ORDER — AMLODIPINE BESYLATE 10 MG PO TABS
10.0000 mg | ORAL_TABLET | Freq: Every day | ORAL | Status: DC
  Administered 2014-06-05 – 2014-06-08 (×4): 10 mg via ORAL
  Filled 2014-06-04 (×4): qty 1

## 2014-06-04 NOTE — Evaluation (Signed)
Physical Therapy Evaluation Patient Details Name: Fernando Ruiz MRN: [id number] DOB: 12/09/1930 Today's Date: 06/04/2014   History of Present Illness  80 year old male with history of secondary parkinsonism, potential dementia, see daily stage II, multiple esophageal dilatations in past 4 years, psychiatric disorders, osteoporosis, hypothyroidism, prior CVA and recent hospitalization with intractable nausea vomiting 11/29.   Admitted 05/31/14 with solemnance and FTT, was not eating at SNF.  Clinical Impression  Patient presents with decreased independence and safety with mobility due to deficits listed in PT problem list.  He will benefit from skilled PT in the acute setting to allow decreased burden of care in next venue and hopeful return home after SNF level rehab stay.  Note patient somewhat improved with mobility over last week when seen during previous admission.     Follow Up Recommendations SNF;Supervision/Assistance - 24 hour    Equipment Recommendations  Other (comment) (TBA)    Recommendations for Other Services       Precautions / Restrictions Precautions Precautions: Fall      Mobility  Bed Mobility Overal bed mobility: Needs Assistance   Rolling: Mod assist Sidelying to sit: Mod assist       General bed mobility comments: max instructional cues and facilitation for initiation  Transfers Overall transfer level: Needs assistance Equipment used: Rolling walker (2 wheeled) Transfers: Sit to/from Stand Sit to Stand: Mod assist;Max assist Stand pivot transfers: Min assist;Mod assist       General transfer comment: heavy lifting assist from bed and facilitation for anterior weight shift; cues and assist for pivotal steps to chair with walker support; increased time with short shuffling steps  Ambulation/Gait                Stairs            Wheelchair Mobility    Modified Rankin (Stroke Patients Only)       Balance Overall balance  assessment: Needs assistance Sitting-balance support: No upper extremity supported;Feet supported Sitting balance-Leahy Scale: Fair Sitting balance - Comments: able to sit unsupported edge of bed safely even initially though leaning left due to foot of bed elevated   Standing balance support: Bilateral upper extremity supported Standing balance-Leahy Scale: Poor Standing balance comment: needs support in addition to walker for safety; stands with erect posture and no lateral lean                             Pertinent Vitals/Pain Pain Assessment: No/denies pain    Home Living Family/patient expects to be discharged to:: Skilled nursing facility                      Prior Function Level of Independence: Needs assistance               Hand Dominance        Extremity/Trunk Assessment   Upper Extremity Assessment: RUE deficits/detail;LUE deficits/detail RUE Deficits / Details: shoulder flexion AAROM to about 95, holds up against gravity; elbow flexion strength 3+/5     LUE Deficits / Details: shoulder flexion AAROM to about 95, holds up against gravity; elbow flexion strength 3+/5   Lower Extremity Assessment: RLE deficits/detail;LLE deficits/detail RLE Deficits / Details: AAROM grossly WFL, but tight heel cords; has tremor in LE's when holding up against gravity; strength grossly 3-/5 hip flexion, 4-/5 knee extension LLE Deficits / Details: AAROM grossly WFL, but tight heel cords; has tremor in LE's when  holding up against gravity; strength grossly 3-/5 hip flexion, 4-/5 knee extension     Communication   Communication: Other (comment) (difficulty understanding due to dentition)  Cognition Arousal/Alertness: Lethargic Behavior During Therapy: Flat affect Overall Cognitive Status: History of cognitive impairments - at baseline       Memory: Decreased short-term memory              General Comments      Exercises General Exercises - Lower  Extremity Ankle Circles/Pumps: AROM;10 reps;Supine;Both      Assessment/Plan    PT Assessment Patient needs continued PT services  PT Diagnosis Generalized weakness;Difficulty walking   PT Problem List Decreased strength;Decreased mobility;Decreased safety awareness;Decreased balance;Decreased activity tolerance;Decreased coordination  PT Treatment Interventions DME instruction;Gait training;Functional mobility training;Therapeutic activities;Therapeutic exercise;Balance training;Neuromuscular re-education;Cognitive remediation;Patient/family education   PT Goals (Current goals can be found in the Care Plan section) Acute Rehab PT Goals Patient Stated Goal: None stated PT Goal Formulation: Patient unable to participate in goal setting Time For Goal Achievement: 06/18/14 Potential to Achieve Goals: Fair    Frequency Min 2X/week   Barriers to discharge        Co-evaluation               End of Session Equipment Utilized During Treatment: Gait belt Activity Tolerance: Patient tolerated treatment well Patient left: in chair;with call bell/phone within reach;with chair alarm set Nurse Communication: Mobility status         Time: [id number] PT Time Calculation (min) (ACUTE ONLY): 31 min   Charges:   PT Evaluation $Initial PT Evaluation Tier I: 1 Procedure PT Treatments $Therapeutic Activity: 8-22 mins   PT G Codes:          Najah Liverman,CYNDI 06/04/2014, 12:50 PM Sheran Lawlessyndi Chani Ghanem, PT [phone] 06/04/2014

## 2014-06-04 NOTE — Progress Notes (Signed)
[facility] PROGRESS NOTE  Donnal MoatJack W Leija ZOX:[id number] DOB: 04/16/1931 DOA: 05/31/2014 PCP: Hoyle SauerAVVA,[name], MD  Brief narrative: Patient is a 80 y/o CM who presented due to FTT 2ary to advanced dementia. Initially wife requested hospice care but currently requesting transitioning to SNF with no hospice or palliative care.    Assessment/Plan: Active Problems:   Dementia - Endstage according the EMR. Most likely leading to failure to thrive.  - Patient currently back at baseline and wife requesting transition to skilled nursing facility -We'll place PT evaluation orders     Failure to thrive in adult - Improved with IV fluid rehydration - SNF on discharge. Social worker aware    Acute on chronic renal failure - Most likely secondary to prerenal causes given history of dehydration. We'll plan on rehydrating - reassess bmp next am  Hypernatremia - Most likely due to continue decreased oral intake - Place on d5 1/2 normal saline and monitor BMP q 8 hours    Hyperkalemia - resolved with IVF's    Dehydration - Continue on soft diet - recurrent problem. Will place on IVF's again.  Hypertension - Not well controlled - will increase amlodipine dose.  Code Status: dnr Family Communication: discussed with spouse Disposition Plan: To SNF once there is improvement in sodium levels.   Consultants:  none  Procedures:  none  Antibiotics:  none  HPI/Subjective: Pt has no new complaints.   Objective: Filed Vitals:   06/04/14 1303  BP: 151/101  Pulse: 96  Temp: 98.6 F (37 C)  Resp: 18    Intake/Output Summary (Last 24 hours) at 06/04/14 1430 Last data filed at 06/04/14 1018  Gross per 24 hour  Intake 1211.25 ml  Output   1800 ml  Net -588.75 ml   Filed Weights   05/31/14 2330  Weight: 66.225 kg (146 lb)    Exam:   General:  Pt in nad, alert and awake  Cardiovascular: rrr, no mrg  Respiratory: cta bl, no wheezes  Abdomen: soft, NT,  ND  Musculoskeletal: no cyanosis or clubbing   Data Reviewed: Basic Metabolic Panel:  Recent Labs Lab 05/31/14 1711 06/04/14 0340  NA 145 157*  K 5.4* 4.9  CL 107 124*  CO2 18* 17*  GLUCOSE 95 111*  BUN 53* 38*  CREATININE 3.74* 3.32*  CALCIUM 9.4 9.3   Liver Function Tests:  Recent Labs Lab 05/31/14 1711  AST 29  ALT 52  ALKPHOS 88  BILITOT 0.4  PROT 7.1  ALBUMIN 2.9*   No results for input(s): LIPASE, AMYLASE in the last 168 hours. No results for input(s): AMMONIA in the last 168 hours. CBC:  Recent Labs Lab 05/31/14 1711  WBC 10.0  NEUTROABS 8.6*  HGB 12.7*  HCT 39.2  MCV 95.1  PLT 240   Cardiac Enzymes: No results for input(s): CKTOTAL, CKMB, CKMBINDEX, TROPONINI in the last 168 hours. BNP (last 3 results) No results for input(s): PROBNP in the last 8760 hours. CBG:  Recent Labs Lab 05/31/14 1601  GLUCAP 112*    Recent Results (from the past 240 hour(s))  Urine culture     Status: None   Collection Time: 05/31/14  5:01 PM  Result Value Ref Range Status   Specimen Description URINE, CATHETERIZED  Final   Special Requests NONE  Final   Culture  Setup Time   Final    05/31/2014 17:25 Performed at [facility]    Colony Count NO GROWTH Performed at [facility]  Final   Culture NO GROWTH [facility]formed at [facility]   Final   Report Status 06/01/2014 FINAL  Final  Gram stain     Status: None   Collection Time: 05/31/14  5:01 PM  Result Value Ref Range Status   Specimen Description URINE, CATHETERIZED  Final   Special Requests NONE  Final   Gram Stain CYTOSPIN PREP NO WBC SEEN NO ORGANISMS SEEN   Final   Report Status 05/31/2014 FINAL  Final     Studies: No results found.  Scheduled Meds: . amLODipine  5 mg Oral Daily  . divalproex  250 mg Oral Daily  . donepezil  10 mg Oral Daily  . levothyroxine  50 mcg Oral QAC breakfast  . risperiDONE  1 mg Oral Daily   Continuous Infusions: . dextrose 5 % and  0.45% NaCl 75 mL/hr at 06/04/14 1021     Time spent: > 35 minutes    [name], Sanita Estrada  [facility] Pager [phone]  If 7PM-7AM, please contact night-coverage at [email or web address], password [email or web address] 06/04/2014, 2:30 PM  LOS: 4 days

## 2014-06-05 DIAGNOSIS — E870 Hyperosmolality and hypernatremia: Secondary | ICD-10-CM

## 2014-06-05 LAB — BASIC METABOLIC PANEL
ANION GAP: 15 (ref 5–15)
ANION GAP: 13 (ref 5–15)
Anion gap: 14 (ref 5–15)
BUN: 38 mg/dL — ABNORMAL HIGH (ref 6–23)
BUN: 38 mg/dL — AB (ref 6–23)
BUN: 37 mg/dL — AB (ref 6–23)
CALCIUM: 9 mg/dL (ref 8.4–10.5)
CO2: 18 mEq/L — ABNORMAL LOW (ref 19–32)
CO2: 18 mEq/L — ABNORMAL LOW (ref 19–32)
CO2: 20 mEq/L (ref 19–32)
CREATININE: 3.29 mg/dL — AB (ref 0.50–1.35)
CREATININE: 3.05 mg/dL — AB (ref 0.50–1.35)
CREATININE: 3.17 mg/dL — AB (ref 0.50–1.35)
Calcium: 9.3 mg/dL (ref 8.4–10.5)
Calcium: 9.1 mg/dL (ref 8.4–10.5)
Chloride: 124 mEq/L — ABNORMAL HIGH (ref 96–112)
Chloride: 118 mEq/L — ABNORMAL HIGH (ref 96–112)
Chloride: 116 mEq/L — ABNORMAL HIGH (ref 96–112)
GFR, EST AFRICAN AMERICAN: 19 mL/min — AB (ref >90)
GFR, EST AFRICAN AMERICAN: 21 mL/min — AB (ref >90)
GFR, EST AFRICAN AMERICAN: 20 mL/min — AB (ref >90)
GFR, EST NON AFRICAN AMERICAN: 16 mL/min — AB (ref >90)
GFR, EST NON AFRICAN AMERICAN: 18 mL/min — AB (ref >90)
GFR, EST NON AFRICAN AMERICAN: 17 mL/min — AB (ref >90)
Glucose, Bld: 136 mg/dL — ABNORMAL HIGH (ref 70–99)
Glucose, Bld: 117 mg/dL — ABNORMAL HIGH (ref 70–99)
Glucose, Bld: 111 mg/dL — ABNORMAL HIGH (ref 70–99)
Potassium: 5 mEq/L (ref 3.7–5.3)
Potassium: 6 mEq/L — ABNORMAL HIGH (ref 3.7–5.3)
Potassium: 4.8 mEq/L (ref 3.7–5.3)
Sodium: 156 mEq/L — ABNORMAL HIGH (ref 137–147)
Sodium: 151 mEq/L — ABNORMAL HIGH (ref 137–147)
Sodium: 149 mEq/L — ABNORMAL HIGH (ref 137–147)

## 2014-06-05 MED ORDER — ENSURE PUDDING PO PUDG
1.0000 | Freq: Three times a day (TID) | ORAL | Status: DC
  Administered 2014-06-05 – 2014-06-08 (×8): 1 via ORAL

## 2014-06-05 MED ORDER — DEXTROSE 5 % IV SOLN
INTRAVENOUS | Status: AC
  Administered 2014-06-05 – 2014-06-06 (×3): via INTRAVENOUS

## 2014-06-05 NOTE — Progress Notes (Signed)
[facility] PROGRESS NOTE  Donnal MoatJack W Egloff ZOX:[id number] DOB: 12/10/1930 DOA: 05/31/2014 PCP: Hoyle SauerAVVA,[name], MD  Brief narrative: Patient is a 80 y/o CM who presented due to FTT 2ary to advanced dementia. Initially wife requested hospice care but currently requesting transitioning to SNF with no hospice or palliative care.    HPI/Subjective: The patient is sleepy today-nonverbal with me  Assessment/Plan: Active Problems:   Dementia - Endstage according the EMR. Most likely leading to failure to thrive.  - Patient currently back at baseline and wife requesting transition to skilled nursing facility -PT has evaluated and is recommending SNF versus 24-hour supervision     Failure to thrive in adult - Improving with IV fluid rehydration    Acute on chronic renal failure/hypernatremia, hyperkalemia and hyperchloremia - Most likely secondary to prerenal causes given history of dehydration.  - Improving with rehydration  Hypertension - Not well controlled - Amlodipine dose increased-continue to follow.  Code Status: dnr Family Communication:  Disposition Plan: To SNF once there is improvement in sodium levels.   Consultants:  none  Procedures:  none  Antibiotics:  none    Objective: Filed Vitals:   06/05/14 1300  BP: 138/74  Pulse: 94  Temp: 98.3 F (36.8 C)  Resp: 18    Intake/Output Summary (Last 24 hours) at 06/05/14 1405 Last data filed at 06/05/14 0900  Gross per 24 hour  Intake 2237.25 ml  Output   2850 ml  Net -612.75 ml   Filed Weights   05/31/14 2330  Weight: 66.225 kg (146 lb)    Exam:   General:  Sleeping, eyes opened manually-pupils equal round reactive to light-patient does not voluntarily open his eyes-nonverbal  Cardiovascular: rrr, no mrg  Respiratory: cta bl, no wheezes  Abdomen: soft, NT, ND  Musculoskeletal: no cyanosis or clubbing   Data Reviewed: Basic Metabolic Panel:  Recent Labs Lab 05/31/14 1711  06/04/14 0340 06/04/14 1431 06/04/14 2213 06/05/14 0631  NA 145 157* 159* 158* 156*  K 5.4* 4.9 4.9 5.1 5.0  CL 107 124* 126* 125* 124*  CO2 18* 17* 20 19 18*  GLUCOSE 95 111* 107* 107* 136*  BUN 53* 38* 38* 37* 38*  CREATININE 3.74* 3.32* 3.32* 3.34* 3.29*  CALCIUM 9.4 9.3 9.2 9.0 9.3   Liver Function Tests:  Recent Labs Lab 05/31/14 1711  AST 29  ALT 52  ALKPHOS 88  BILITOT 0.4  PROT 7.1  ALBUMIN 2.9*   No results for input(s): LIPASE, AMYLASE in the last 168 hours. No results for input(s): AMMONIA in the last 168 hours. CBC:  Recent Labs Lab 05/31/14 1711  WBC 10.0  NEUTROABS 8.6*  HGB 12.7*  HCT 39.2  MCV 95.1  PLT 240   Cardiac Enzymes: No results for input(s): CKTOTAL, CKMB, CKMBINDEX, TROPONINI in the last 168 hours. BNP (last 3 results) No results for input(s): PROBNP in the last 8760 hours. CBG:  Recent Labs Lab 05/31/14 1601  GLUCAP 112*    Recent Results (from the past 240 hour(s))  Urine culture     Status: None   Collection Time: 05/31/14  5:01 PM  Result Value Ref Range Status   Specimen Description URINE, CATHETERIZED  Final   Special Requests NONE  Final   Culture  Setup Time   Final    05/31/2014 17:25 Performed at [facility]    Colony Count NO GROWTH Performed at [facility]   Final   Culture NO GROWTH Performed at [facility]  Final   Report Status 06/01/2014 FINAL  Final  Gram stain     Status: None   Collection Time: 05/31/14  5:01 PM  Result Value Ref Range Status   Specimen Description URINE, CATHETERIZED  Final   Special Requests NONE  Final   Gram Stain CYTOSPIN PREP NO WBC SEEN NO ORGANISMS SEEN   Final   Report Status 05/31/2014 FINAL  Final     Studies: No results found.  Scheduled Meds: . amLODipine  10 mg Oral Daily  . divalproex  250 mg Oral Daily  . donepezil  10 mg Oral Daily  . feeding supplement (ENSURE)  1 Container Oral TID BM  . levothyroxine  50 mcg Oral QAC  breakfast  . risperiDONE  1 mg Oral Daily   Continuous Infusions: . dextrose 100 mL/hr at 06/05/14 0817     Time spent: > 35 minutes    Rue Tinnel, MD  [facility] [email or web address], password [email or web address] 06/05/2014, 2:05 PM  LOS: 5 days

## 2014-06-06 LAB — BASIC METABOLIC PANEL
ANION GAP: 13 (ref 5–15)
Anion gap: 16 — ABNORMAL HIGH (ref 5–15)
BUN: 35 mg/dL — AB (ref 6–23)
BUN: 35 mg/dL — ABNORMAL HIGH (ref 6–23)
CALCIUM: 9.2 mg/dL (ref 8.4–10.5)
CO2: 19 mEq/L (ref 19–32)
CO2: 18 mEq/L — ABNORMAL LOW (ref 19–32)
CREATININE: 3.02 mg/dL — AB (ref 0.50–1.35)
Calcium: 9.4 mg/dL (ref 8.4–10.5)
Chloride: 121 mEq/L — ABNORMAL HIGH (ref 96–112)
Chloride: 112 mEq/L (ref 96–112)
Creatinine, Ser: 3.24 mg/dL — ABNORMAL HIGH (ref 0.50–1.35)
GFR calc non Af Amer: 17 mL/min — ABNORMAL LOW (ref >90)
GFR, EST AFRICAN AMERICAN: 19 mL/min — AB (ref >90)
GFR, EST AFRICAN AMERICAN: 21 mL/min — AB (ref >90)
GFR, EST NON AFRICAN AMERICAN: 18 mL/min — AB (ref >90)
Glucose, Bld: 114 mg/dL — ABNORMAL HIGH (ref 70–99)
Glucose, Bld: 119 mg/dL — ABNORMAL HIGH (ref 70–99)
POTASSIUM: 5 meq/L (ref 3.7–5.3)
Potassium: 4.8 mEq/L (ref 3.7–5.3)
Sodium: 153 mEq/L — ABNORMAL HIGH (ref 137–147)
Sodium: 146 mEq/L (ref 137–147)

## 2014-06-06 LAB — SODIUM, URINE, RANDOM: Sodium, Ur: 51 mEq/L

## 2014-06-06 MED ORDER — DEXTROSE 5 % IV SOLN
INTRAVENOUS | Status: AC
Start: 2014-06-06 — End: 2014-06-07
  Administered 2014-06-06 – 2014-06-07 (×3): via INTRAVENOUS

## 2014-06-06 NOTE — Progress Notes (Signed)
[facility] PROGRESS NOTE  Fernando Ruiz XBJ:[id number] DOB: 04/23/1931 DOA: 05/31/2014 PCP: Hoyle SauerAVVA,[name], MD  Brief narrative: Patient is a 80 y/o CM who presented due to FTT 2ary to advanced dementia. Initially wife requested hospice care but currently requesting transitioning to SNF with no hospice or palliative care.    HPI/Subjective: Alert today- poor memory - not sure of where he is or why he is here. No complaints.   Assessment/Plan: Active Problems:   Dementia - Endstage according the EMR.  - Patient currently back at baseline -PT has evaluated and is recommending SNF versus 24-hour supervision     Failure to thrive in adult - cont Ensure    Acute on chronic renal failure/hypernatremia, hyperkalemia and hyperchloremia - initially suspected to be prerenal but despite D5W, sodium not significantly improved- having a large amount of urine output and is in negative balance by 5 L - will recheck Bmet now and also check U and S osm and U and S Na+- may need to r//o DI-   Hypertension - Amlodipine dose increased - better controlled- continue to follow.  Code Status: dnr Family Communication:  Disposition Plan: To SNF once there is improvement in sodium levels.   Consultants:  none  Procedures:  none  Antibiotics:  none  Objective: Filed Vitals:   06/06/14 1430  BP: 154/102  Pulse: 115  Temp: 98 F (36.7 C)  Resp: 18    Intake/Output Summary (Last 24 hours) at 06/06/14 1738 Last data filed at 06/06/14 1647  Gross per 24 hour  Intake    960 ml  Output   3425 ml  Net  -2465 ml   Filed Weights   05/31/14 2330  Weight: 66.225 kg (146 lb)    Exam:   General:  Alert, confused, no distress   Cardiovascular: rrr, no mrg  Respiratory: cta bl, no wheezes  Abdomen: soft, NT, ND, BS+  Musculoskeletal: no cyanosis or clubbing   Data Reviewed: Basic Metabolic Panel:  Recent Labs Lab 06/04/14 2213 06/05/14 0631 06/05/14 1505  06/05/14 2211 06/06/14 0525  NA 158* 156* 151* 149* 153*  K 5.1 5.0 6.0* 4.8 4.8  CL 125* 124* 118* 116* 121*  CO2 19 18* 18* 20 19  GLUCOSE 107* 136* 117* 111* 114*  BUN 37* 38* 38* 37* 35*  CREATININE 3.34* 3.29* 3.05* 3.17* 3.24*  CALCIUM 9.0 9.3 9.1 9.0 9.2   Liver Function Tests:  Recent Labs Lab 05/31/14 1711  AST 29  ALT 52  ALKPHOS 88  BILITOT 0.4  PROT 7.1  ALBUMIN 2.9*   No results for input(s): LIPASE, AMYLASE in the last 168 hours. No results for input(s): AMMONIA in the last 168 hours. CBC:  Recent Labs Lab 05/31/14 1711  WBC 10.0  NEUTROABS 8.6*  HGB 12.7*  HCT 39.2  MCV 95.1  PLT 240   Cardiac Enzymes: No results for input(s): CKTOTAL, CKMB, CKMBINDEX, TROPONINI in the last 168 hours. BNP (last 3 results) No results for input(s): PROBNP in the last 8760 hours. CBG:  Recent Labs Lab 05/31/14 1601  GLUCAP 112*    Recent Results (from the past 240 hour(s))  Urine culture     Status: None   Collection Time: 05/31/14  5:01 PM  Result Value Ref Range Status   Specimen Description URINE, CATHETERIZED  Final   Special Requests NONE  Final   Culture  Setup Time   Final    05/31/2014 17:25 Performed at [facility]  Colony Count NO GROWTH Performed at [facility]   Final   Culture NO GROWTH Performed at [facility]   Final   Report Status 06/01/2014 FINAL  Final  Gram stain     Status: None   Collection Time: 05/31/14  5:01 PM  Result Value Ref Range Status   Specimen Description URINE, CATHETERIZED  Final   Special Requests NONE  Final   Gram Stain CYTOSPIN PREP NO WBC SEEN NO ORGANISMS SEEN   Final   Report Status 05/31/2014 FINAL  Final     Studies: No results found.  Scheduled Meds: . amLODipine  10 mg Oral Daily  . divalproex  250 mg Oral Daily  . donepezil  10 mg Oral Daily  . feeding supplement (ENSURE)  1 Container Oral TID BM  . levothyroxine  50 mcg Oral QAC breakfast  . risperiDONE  1  mg Oral Daily   Continuous Infusions: . dextrose 100 mL/hr at 06/06/14 1500     Time spent: > 35 minutes    Muaad Boehning, MD  [facility] [email or web address], password [email or web address] 06/06/2014, 5:38 PM  LOS: 6 days

## 2014-06-06 NOTE — Clinical Social Work Note (Signed)
Updated clinicals faxed to [facility]. CSW continuing to follow to assist with discharge once pt medically stable for discharge.  Marcelline Deistmily Armon Orvis, LCSWA [phone]) Licensed Clinical Social Worker Orthopedics [phone]([id number]) and Surgical [phone]([id number])

## 2014-06-07 LAB — OSMOLALITY, URINE: Osmolality, Ur: 181 mOsm/kg — ABNORMAL LOW (ref 390–1090)

## 2014-06-07 LAB — OSMOLALITY: OSMOLALITY: 313 mosm/kg — AB (ref 275–300)

## 2014-06-07 MED ORDER — ENSURE PUDDING PO PUDG: 1.0000 | Freq: Three times a day (TID) | ORAL | Status: AC

## 2014-06-07 MED ORDER — AMLODIPINE BESYLATE 5 MG PO TABS: 10.0000 mg | ORAL_TABLET | Freq: Every day | ORAL | Status: DC

## 2014-06-07 NOTE — Progress Notes (Signed)
Physical Therapy Treatment Patient Details Name: Fernando Ruiz MRN: [id number] DOB: 12/30/1930 Today's Date: 06/07/2014    History of Present Illness 80 year old male with history of secondary parkinsonism, potential dementia, see daily stage II, multiple esophageal dilatations in past 4 years, psychiatric disorders, osteoporosis, hypothyroidism, prior CVA and recent hospitalization with intractable nausea vomiting 11/29.   Admitted 05/31/14 with solemnance and FTT, was not eating at SNF.    PT Comments    Pt was seen for continued therapy and was somewhat tired today.  He was resistant to doing much, and explained to nursing that he did not get up in chair as he was resistant to the process.  Has some limited cognitive abilities and will need inpt placement to succeed in getting home with wife, who cannot lift him.  Follow Up Recommendations  SNF;Supervision/Assistance - 24 hour     Equipment Recommendations  Other (comment);Rolling walker with 5" wheels;3in1 (PT) (pending outcome of rehab)    Recommendations for Other Services       Precautions / Restrictions Precautions Precautions: Fall Restrictions Weight Bearing Restrictions: No    Mobility  Bed Mobility Overal bed mobility: Needs Assistance Bed Mobility: Rolling;Supine to Sit   Sidelying to sit: Max assist Supine to sit: Max assist;HOB elevated     General bed mobility comments: Pt was maybe tired but actively resisted PT to sit up and was able to discuss his progress with wife who was present  Transfers Overall transfer level: Needs assistance Equipment used: 1 person hand held assist Transfers: Sit to/from Stand Sit to Stand: Total assist (actively resisted)            Ambulation/Gait                 Stairs            Wheelchair Mobility    Modified Rankin (Stroke Patients Only)       Balance Overall balance assessment: Needs assistance Sitting-balance support: Bilateral upper  extremity supported Sitting balance-Leahy Scale: Poor     Standing balance support:  (unable to get up to fully stand) Standing balance-Leahy Scale: Poor Standing balance comment: resisting the efforts                    Cognition Arousal/Alertness: Lethargic Behavior During Therapy: Flat affect Overall Cognitive Status: History of cognitive impairments - at baseline       Memory: Decreased recall of precautions;Decreased short-term memory              Exercises      General Comments General comments (skin integrity, edema, etc.): pt was able to assist with all efforts somewhat butfinally just resisted the rest      Pertinent Vitals/Pain Pain Assessment: No/denies pain    Home Living                      Prior Function            PT Goals (current goals can now be found in the care plan section) Acute Rehab PT Goals Patient Stated Goal: none stated PT Goal Formulation: Patient unable to participate in goal setting Progress towards PT goals: Progressing toward goals    Frequency  Min 2X/week    PT Plan Current plan remains appropriate    Co-evaluation             End of Session   Activity Tolerance: Patient limited by lethargy Patient left: in bed;with call  bell/phone within reach;with bed alarm set;with family/visitor present     Time: [id number] PT Time Calculation (min) (ACUTE ONLY): 23 min  Charges:  $Therapeutic Activity: 23-37 mins                    G Codes:      Fernando Ruiz, Fernando Ruiz 06/07/2014, 5:33 PM   Fernando Ruiz, PT MS Acute Rehab Dept. Number: [phone]

## 2014-06-07 NOTE — Discharge Summary (Signed)
Physician Discharge Summary  Fernando MoatJack W Steinberg ZOX:[id number] DOB: 10/18/1930 DOA: 05/31/2014  PCP: Hoyle SauerAVVA,[name], MD  Admit date: 05/31/2014 Discharge date: 06/07/2014  Time spent: 55 minutes  Recommendations for Outpatient Follow-up:  1. F/u BP and adjust meds as needed 2. Bmet on 12/2 3. Have discontinued benzodiazepines  4. Nutrition consult at nursing facility 5. Assist with feeds and encourage liquids  Discharge Condition: stable Diet recommendation: regular diet- pureed (no dentures)  Discharge Diagnoses:  Principal Problem:   Acute on chronic renal failure Active Problems:   Hypernatremia   Dehydration   Secondary parkinsonism   Hypothyroidism   Dementia   Failure to thrive in adult   Hyperkalemia   History of present illness:  Fernando Ruiz is a 80 y.o. male   has a past medical history of Resting tremor; Memory loss; Unspecified cerebral artery occlusion with cerebral infarction; Secondary parkinsonism; High cholesterol; Bipolar disorder; Hypothyroidism; and Dementia.  Patient was admitted from 05/18/14 to 05/28/14 secondary to dehydration and decreased PO intake due to esophageal obstruction. He has hx of Prior esophageal strictures s/p dilatation in 2002. Patient underwent esophageal food disimpaction. EDG showing some erosive gastritis. He was discharged to [facility]. After arrival to SNF he soon stopped eating again and became progressively lethargic and poorly responsive. On Arrival to ER Cr was noted to be up from 3.45 to 3.74 with a K at 5.4  Hospital Course:  Active Problems: Acute on chronic renal failure, hyperkalemia  - Most likely prerenal cause  - Improved with rehydration - high risk of recurrence- PO intake quite poor  Hypernatremia/ hyperchloremia - due to IVF- transitioned to D5W with resolution  Lethargy on admission - possibly due to dehydration along with benzodiazepines-  d/c Ativan (PRN) and Clonazepam (takes daily) which he  was receiving at the nursing facility- he has not received any since admission and has been quite stable, pleasant and cooperative   Dementia - Endstage according the EMR. Most likely leading to failure to thrive.  - Patient currently back at baseline and wife requesting transition to skilled nursing facility -PT has evaluated and is recommending SNF versus 24-hour supervision   Failure to thrive in adult - added Ensure pudding  Hypertension - Not well controlled - Amlodipine dose increased - BP improved this AM  Procedures:  none  Consultations:  none  Discharge Exam: Filed Weights   05/31/14 2330  Weight: 66.225 kg (146 lb)   Filed Vitals:   06/07/14 0553  BP: 133/89  Pulse: 96  Temp: 98.7 F (37.1 C)  Resp: 16    General: alert, pleasantly confused, no distress Cardiovascular: RRR, no murmurs  Respiratory: clear to auscultation bilaterally GI: soft, non-tender, non-distended, bowel sound positive  Discharge Instructions You were cared for by a hospitalist during your hospital stay. If you have any questions about your discharge medications or the care you received while you were in the hospital after you are discharged, you can call the unit and asked to speak with the hospitalist on call if the hospitalist that took care of you is not available. Once you are discharged, your primary care physician will handle any further medical issues. Please note that NO REFILLS for any discharge medications will be authorized once you are discharged, as it is imperative that you return to your primary care physician (or establish a relationship with a primary care physician if you do not have one) for your aftercare needs so that they can reassess your need for medications  and monitor your lab values.     Medication List    ASK your doctor about these medications        amLODipine 5 MG tablet  Commonly known as:  NORVASC  Take 5 mg by mouth daily.     clonazePAM 1 MG  tablet  Commonly known as:  KLONOPIN  Take 1 tablet (1 mg total) by mouth daily.     divalproex 250 MG 24 hr tablet  Commonly known as:  DEPAKOTE ER  Take 250 mg by mouth daily.     donepezil 10 MG tablet  Commonly known as:  ARICEPT  Take 1 tablet (10 mg total) by mouth daily.     finasteride 5 MG tablet  Commonly known as:  PROSCAR  Take 5 mg by mouth daily.     levothyroxine 50 MCG tablet  Commonly known as:  SYNTHROID, LEVOTHROID  Take 50 mcg by mouth daily.     MYRBETRIQ 25 MG Tb24 tablet  Generic drug:  mirabegron ER  Take 25 mg by mouth daily.     omeprazole 40 MG capsule  Commonly known as:  PRILOSEC  Take 40 mg by mouth daily.     risperiDONE 1 MG tablet  Commonly known as:  RISPERDAL  Take 1 mg by mouth daily.     simvastatin 20 MG tablet  Commonly known as:  ZOCOR  Take 20 mg by mouth daily.     sodium bicarbonate 650 MG tablet  Take 1 tablet (650 mg total) by mouth 3 (three) times daily.     trihexyphenidyl 2 MG tablet  Commonly known as:  ARTANE  Take 1 tablet (2 mg total) by mouth daily.       Allergies  Allergen Reactions  . Penicillins Rash      The results of significant diagnostics from this hospitalization (including imaging, microbiology, ancillary and laboratory) are listed below for reference.    Significant Diagnostic Studies: Ct Abdomen Pelvis Wo Contrast  05/18/2014   CLINICAL DATA:  Constipation for the past 3 days. Nausea and vomiting for the past 2 days. Worsening abdominal pain for the past 2 days.  EXAM: CT ABDOMEN AND PELVIS WITHOUT CONTRAST  TECHNIQUE: Multidetector CT imaging of the abdomen and pelvis was performed following the standard protocol without IV contrast.  COMPARISON:  None.  FINDINGS: Small sliding hiatal hernia. Calcified granulomata in the liver. Unremarkable non contrasted appearance of the spleen, pancreas, adrenal glands, urinary bladder and prostate gland.  1.2 cm gallstone in the gallbladder. No gallbladder  wall thickening or pericholecystic fluid. 1.2 cm mid left renal cyst or calyceal diverticulum with dependent calcific density. 6 mm hemorrhagic lower pole left renal cyst. Both kidneys are somewhat small.  Scattered colonic diverticula without evidence of diverticulitis. No enlarged lymph nodes. Atheromatous arterial calcifications. Normal amount of stool in the colon. Mild bilateral lower lobe cylindrical bronchiectasis. Lumbar and lower thoracic spine degenerative changes and mild scoliosis.  IMPRESSION: 1. No acute abnormality. 2. Small sliding hiatal hernia. 3. Cholelithiasis. 4. Mild bilateral lower lobe cylindrical bronchiectasis.   Electronically Signed   By: Gordan Payment M.D.   On: 05/18/2014 14:08   Dg Chest 2 View  05/19/2014   CLINICAL DATA:  Pneumonia  EXAM: CHEST  2 VIEW  COMPARISON:  01/31/2013  FINDINGS: Cardiomediastinal silhouette is stable. No acute infiltrate or pleural effusion. No pulmonary edema. Osteopenia and mild degenerative changes mid thoracic spine. Atherosclerotic calcifications of thoracic aorta.  IMPRESSION: No active cardiopulmonary disease.  Electronically Signed   By: Natasha Mead M.D.   On: 05/19/2014 16:44   Dg Abd 1 View  05/31/2014   CLINICAL DATA:  Failure to thrive.  Abdominal pain.  EXAM: ABDOMEN - 1 VIEW  COMPARISON:  05/18/2014  FINDINGS: Old healed bilateral rib fractures. Atherosclerosis noted. Bony demineralization.  Bowel gas pattern within normal limits. Scattered vascular calcifications noted.  IMPRESSION: 1. Bowel gas pattern within normal limits. 2. Atherosclerosis and scattered vascular calcifications. 3. Bony demineralization.   Electronically Signed   By: Herbie Baltimore M.D.   On: 05/31/2014 19:51   US Abdomen Complete  05/21/2014   CLINICAL DATA:  History of gallstones ; dialysis dependent chronic renal failure  EXAM: ULTRASOUND ABDOMEN COMPLETE  COMPARISON:  CT scan of the abdomen and pelvis of May 18 2014  FINDINGS: Gallbladder: The  gallbladder is adequately distended. There is a 11 mm diameter mobile mobile echogenic shadowing stone. There is no gallbladder wall thickening, pericholecystic fluid, or positive sonographic Murphy's sign.  Common bile duct: Diameter: 4.3 mm  Liver: No focal lesion identified. Within normal limits in parenchymal echogenicity.  IVC: No abnormality visualized.  Pancreas: Visualized portion unremarkable.  Spleen: Size and appearance within normal limits.  Right Kidney: Length: 10.5 cm. There is cortical thinning diffusely with increased cortical echotexture.  Left Kidney: Length: 10 cm. There is cortical thinning diffusely and increased cortical echotexture. There is a 1.9 cm simple appearing upper pole cyst.  Abdominal aorta: No aneurysm visualized.  Other findings: None.  IMPRESSION: 1. Cholelithiasis without evidence of acute cholecystitis. 2. Changes within the kidneys consistent with chronic renal failure.   Electronically Signed   By: David  [place]   On: 05/21/2014 08:13   Dg Esophagus  05/20/2014   CLINICAL DATA:  Vomiting.  Dysphagia.  ICD10: R 13.1.  Dementia.  EXAM: ESOPHAGUS/BARIUM SWALLOW/TABLET STUDY  COMPARISON:  Chest radiograph of 05/19/2014. Abdominal pelvic CT of 05/18/2014.  FINDINGS: Exam could not be performed. Patient was somnolent, and only mildly arousable. Attempt was made to have the patient swallow contrast, without success.  IMPRESSION: Unsuccessful attempt at esophagram. Given the clinical history of dementia and dysphasia, recommend speech pathology evaluation prior to attempting repeat esophagram. Esophagram should also be withheld until the patient can follow directions.   Electronically Signed   By: Jeronimo Greaves M.D.   On: 05/20/2014 16:05   Dg Chest Port 1 View  05/31/2014   CLINICAL DATA:  Altered mental status and failure to thrive for 3 days.  EXAM: PORTABLE CHEST - 1 VIEW  COMPARISON:  05/22/2014  FINDINGS: The heart is borderline enlarged but stable. There is tortuosity  and calcification of the thoracic aorta. Low lung volumes with vascular crowding and atelectasis. No definite infiltrates or effusions.  IMPRESSION: Low lung volumes with vascular crowding and atelectasis. No definite infiltrates or effusions.   Electronically Signed   By: Loralie Champagne M.D.   On: 05/31/2014 16:53   Dg Chest Port 1 View  05/22/2014   CLINICAL DATA:  Leukocytosis.  Weakness.  EXAM: PORTABLE CHEST - 1 VIEW  COMPARISON:  05/19/2014.  01/31/2013.  FINDINGS: Low volume chest. Probable superimposed interstitial pulmonary edema. Basilar atelectasis. Tortuous or ectatic thoracic aortic arch with atherosclerosis. Cardiopericardial silhouette is probably unchanged allowing for lower lung volumes.  No gross focal consolidation. Probable calcified lymph node in the RIGHT hilum.  IMPRESSION: Low volume chest with probable interstitial pulmonary edema.   Electronically Signed   By: Andreas Newport M.D.   On:  05/22/2014 08:53    Microbiology: Recent Results (from the past 240 hour(s))  Urine culture     Status: None   Collection Time: 05/31/14  5:01 PM  Result Value Ref Range Status   Specimen Description URINE, CATHETERIZED  Final   Special Requests NONE  Final   Culture  Setup Time   Final    05/31/2014 17:25 Performed at [facility]    Colony Count NO GROWTH Performed at [facility]   Final   Culture NO GROWTH Performed at [facility]   Final   Report Status 06/01/2014 FINAL  Final  Gram stain     Status: None   Collection Time: 05/31/14  5:01 PM  Result Value Ref Range Status   Specimen Description URINE, CATHETERIZED  Final   Special Requests NONE  Final   Gram Stain CYTOSPIN PREP NO WBC SEEN NO ORGANISMS SEEN   Final   Report Status 05/31/2014 FINAL  Final     Labs: Basic Metabolic Panel:  Recent Labs Lab 06/05/14 0631 06/05/14 1505 06/05/14 2211 06/06/14 0525 06/06/14 1855  NA 156* 151* 149* 153* 146  K 5.0 6.0* 4.8 4.8 5.0  CL  124* 118* 116* 121* 112  CO2 18* 18* 20 19 18*  GLUCOSE 136* 117* 111* 114* 119*  BUN 38* 38* 37* 35* 35*  CREATININE 3.29* 3.05* 3.17* 3.24* 3.02*  CALCIUM 9.3 9.1 9.0 9.2 9.4   Liver Function Tests:  Recent Labs Lab 05/31/14 1711  AST 29  ALT 52  ALKPHOS 88  BILITOT 0.4  PROT 7.1  ALBUMIN 2.9*   No results for input(s): LIPASE, AMYLASE in the last 168 hours. No results for input(s): AMMONIA in the last 168 hours. CBC:  Recent Labs Lab 05/31/14 1711  WBC 10.0  NEUTROABS 8.6*  HGB 12.7*  HCT 39.2  MCV 95.1  PLT 240   Cardiac Enzymes: No results for input(s): CKTOTAL, CKMB, CKMBINDEX, TROPONINI in the last 168 hours. BNP: BNP (last 3 results) No results for input(s): PROBNP in the last 8760 hours. CBG:  Recent Labs Lab 05/31/14 1601  GLUCAP 112*       SignedCalvert Cantor:  Xachary Hambly, MD [facility] 06/07/2014, 10:06 AM

## 2014-06-08 ENCOUNTER — Inpatient Hospital Stay (HOSPITAL_COMMUNITY)
Admission: EM | Admit: 2014-06-08 | Discharge: 2014-06-12 | DRG: 871 | Payer: Medicare Other | Attending: Internal Medicine | Admitting: Internal Medicine

## 2014-06-08 ENCOUNTER — Emergency Department (HOSPITAL_COMMUNITY): Payer: Medicare Other

## 2014-06-08 ENCOUNTER — Encounter (HOSPITAL_COMMUNITY): Payer: Self-pay | Admitting: Emergency Medicine

## 2014-06-08 DIAGNOSIS — R1310 Dysphagia, unspecified: Secondary | ICD-10-CM | POA: Diagnosis present

## 2014-06-08 DIAGNOSIS — E870 Hyperosmolality and hypernatremia: Secondary | ICD-10-CM | POA: Diagnosis present

## 2014-06-08 DIAGNOSIS — A419 Sepsis, unspecified organism: Secondary | ICD-10-CM | POA: Diagnosis not present

## 2014-06-08 DIAGNOSIS — E8729 Other acidosis: Secondary | ICD-10-CM | POA: Insufficient documentation

## 2014-06-08 DIAGNOSIS — J9811 Atelectasis: Secondary | ICD-10-CM | POA: Diagnosis present

## 2014-06-08 DIAGNOSIS — R627 Adult failure to thrive: Secondary | ICD-10-CM | POA: Diagnosis present

## 2014-06-08 DIAGNOSIS — I639 Cerebral infarction, unspecified: Secondary | ICD-10-CM

## 2014-06-08 DIAGNOSIS — N189 Chronic kidney disease, unspecified: Secondary | ICD-10-CM | POA: Diagnosis present

## 2014-06-08 DIAGNOSIS — I6350 Cerebral infarction due to unspecified occlusion or stenosis of unspecified cerebral artery: Secondary | ICD-10-CM | POA: Diagnosis present

## 2014-06-08 DIAGNOSIS — Z66 Do not resuscitate: Secondary | ICD-10-CM | POA: Diagnosis present

## 2014-06-08 DIAGNOSIS — E860 Dehydration: Secondary | ICD-10-CM | POA: Diagnosis present

## 2014-06-08 DIAGNOSIS — J479 Bronchiectasis, uncomplicated: Secondary | ICD-10-CM | POA: Diagnosis present

## 2014-06-08 DIAGNOSIS — E039 Hypothyroidism, unspecified: Secondary | ICD-10-CM | POA: Diagnosis present

## 2014-06-08 DIAGNOSIS — E872 Acidosis: Secondary | ICD-10-CM | POA: Diagnosis present

## 2014-06-08 DIAGNOSIS — F319 Bipolar disorder, unspecified: Secondary | ICD-10-CM | POA: Diagnosis present

## 2014-06-08 DIAGNOSIS — Z515 Encounter for palliative care: Secondary | ICD-10-CM | POA: Diagnosis not present

## 2014-06-08 DIAGNOSIS — Z8673 Personal history of transient ischemic attack (TIA), and cerebral infarction without residual deficits: Secondary | ICD-10-CM | POA: Diagnosis not present

## 2014-06-08 DIAGNOSIS — R638 Other symptoms and signs concerning food and fluid intake: Secondary | ICD-10-CM

## 2014-06-08 DIAGNOSIS — Z823 Family history of stroke: Secondary | ICD-10-CM | POA: Diagnosis not present

## 2014-06-08 DIAGNOSIS — R6521 Severe sepsis with septic shock: Secondary | ICD-10-CM | POA: Diagnosis present

## 2014-06-08 DIAGNOSIS — A4181 Sepsis due to Enterococcus: Principal | ICD-10-CM | POA: Diagnosis present

## 2014-06-08 DIAGNOSIS — I700 Atherosclerosis of aorta: Secondary | ICD-10-CM | POA: Diagnosis present

## 2014-06-08 DIAGNOSIS — G219 Secondary parkinsonism, unspecified: Secondary | ICD-10-CM | POA: Diagnosis present

## 2014-06-08 DIAGNOSIS — K449 Diaphragmatic hernia without obstruction or gangrene: Secondary | ICD-10-CM | POA: Diagnosis present

## 2014-06-08 DIAGNOSIS — G3183 Dementia with Lewy bodies: Secondary | ICD-10-CM | POA: Diagnosis present

## 2014-06-08 DIAGNOSIS — N390 Urinary tract infection, site not specified: Secondary | ICD-10-CM | POA: Diagnosis present

## 2014-06-08 DIAGNOSIS — E780 Pure hypercholesterolemia: Secondary | ICD-10-CM | POA: Diagnosis present

## 2014-06-08 DIAGNOSIS — N179 Acute kidney failure, unspecified: Secondary | ICD-10-CM | POA: Diagnosis present

## 2014-06-08 DIAGNOSIS — F0390 Unspecified dementia without behavioral disturbance: Secondary | ICD-10-CM

## 2014-06-08 DIAGNOSIS — G20 Parkinson's disease: Secondary | ICD-10-CM | POA: Insufficient documentation

## 2014-06-08 DIAGNOSIS — I519 Heart disease, unspecified: Secondary | ICD-10-CM | POA: Diagnosis not present

## 2014-06-08 DIAGNOSIS — M419 Scoliosis, unspecified: Secondary | ICD-10-CM | POA: Diagnosis present

## 2014-06-08 DIAGNOSIS — F0391 Unspecified dementia with behavioral disturbance: Secondary | ICD-10-CM | POA: Diagnosis not present

## 2014-06-08 DIAGNOSIS — K8020 Calculus of gallbladder without cholecystitis without obstruction: Secondary | ICD-10-CM | POA: Diagnosis present

## 2014-06-08 DIAGNOSIS — J690 Pneumonitis due to inhalation of food and vomit: Secondary | ICD-10-CM | POA: Diagnosis present

## 2014-06-08 DIAGNOSIS — E038 Other specified hypothyroidism: Secondary | ICD-10-CM | POA: Insufficient documentation

## 2014-06-08 DIAGNOSIS — E871 Hypo-osmolality and hyponatremia: Secondary | ICD-10-CM | POA: Diagnosis present

## 2014-06-08 DIAGNOSIS — F3130 Bipolar disorder, current episode depressed, mild or moderate severity, unspecified: Secondary | ICD-10-CM | POA: Diagnosis not present

## 2014-06-08 DIAGNOSIS — R0600 Dyspnea, unspecified: Secondary | ICD-10-CM | POA: Diagnosis not present

## 2014-06-08 LAB — CBC WITH DIFFERENTIAL/PLATELET
Basophils Absolute: 0 10*3/uL (ref 0.0–0.1)
Basophils Relative: 0 % (ref 0–1)
Eosinophils Absolute: 0 10*3/uL (ref 0.0–0.7)
Eosinophils Relative: 0 % (ref 0–5)
HCT: 40.6 % (ref 39.0–52.0)
Hemoglobin: 12.9 g/dL — ABNORMAL LOW (ref 13.0–17.0)
LYMPHS ABS: 1.4 10*3/uL (ref 0.7–4.0)
LYMPHS PCT: 5 % — AB (ref 12–46)
MCH: 30.5 pg (ref 26.0–34.0)
MCHC: 31.8 g/dL (ref 30.0–36.0)
MCV: 96 fL (ref 78.0–100.0)
Monocytes Absolute: 1.6 10*3/uL — ABNORMAL HIGH (ref 0.1–1.0)
Monocytes Relative: 6 % (ref 3–12)
Neutro Abs: 24.4 10*3/uL — ABNORMAL HIGH (ref 1.7–7.7)
Neutrophils Relative %: 89 % — ABNORMAL HIGH (ref 43–77)
Platelets: 221 10*3/uL (ref 150–400)
RBC: 4.23 MIL/uL (ref 4.22–5.81)
RDW: 14.2 % (ref 11.5–15.5)
WBC: 27.4 10*3/uL — AB (ref 4.0–10.5)

## 2014-06-08 LAB — COMPREHENSIVE METABOLIC PANEL
ALK PHOS: 128 U/L — AB (ref 39–117)
ALT: 38 U/L (ref 0–53)
ANION GAP: 20 — AB (ref 5–15)
AST: 27 U/L (ref 0–37)
Albumin: 2.5 g/dL — ABNORMAL LOW (ref 3.5–5.2)
BUN: 67 mg/dL — AB (ref 6–23)
CO2: 17 mEq/L — ABNORMAL LOW (ref 19–32)
Calcium: 10 mg/dL (ref 8.4–10.5)
Chloride: 113 mEq/L — ABNORMAL HIGH (ref 96–112)
Creatinine, Ser: 4.07 mg/dL — ABNORMAL HIGH (ref 0.50–1.35)
GFR calc Af Amer: 15 mL/min — ABNORMAL LOW (ref >90)
GFR calc non Af Amer: 13 mL/min — ABNORMAL LOW (ref >90)
Glucose, Bld: 177 mg/dL — ABNORMAL HIGH (ref 70–99)
Potassium: 5.2 mEq/L (ref 3.7–5.3)
Sodium: 150 mEq/L — ABNORMAL HIGH (ref 137–147)
TOTAL PROTEIN: 7.7 g/dL (ref 6.0–8.3)
Total Bilirubin: 0.7 mg/dL (ref 0.3–1.2)

## 2014-06-08 LAB — I-STAT CG4 LACTIC ACID, ED
LACTIC ACID, VENOUS: 2.16 mmol/L (ref 0.5–2.2)
LACTIC ACID, VENOUS: 1.49 mmol/L (ref 0.5–2.2)
Lactic Acid, Venous: 1.62 mmol/L (ref 0.5–2.2)

## 2014-06-08 LAB — URINALYSIS, ROUTINE W REFLEX MICROSCOPIC
Bilirubin Urine: NEGATIVE
Glucose, UA: NEGATIVE mg/dL
Ketones, ur: NEGATIVE mg/dL
NITRITE: NEGATIVE
Protein, ur: 30 mg/dL — AB
SPECIFIC GRAVITY, URINE: 1.014 (ref 1.005–1.030)
UROBILINOGEN UA: 1 mg/dL (ref 0.0–1.0)
pH: 5.5 (ref 5.0–8.0)

## 2014-06-08 LAB — URINE MICROSCOPIC-ADD ON

## 2014-06-08 MED ORDER — VANCOMYCIN HCL IN DEXTROSE 1-5 GM/200ML-% IV SOLN
1000.0000 mg | Freq: Once | INTRAVENOUS | Status: AC
  Administered 2014-06-08: 1000 mg via INTRAVENOUS
  Filled 2014-06-08: qty 200

## 2014-06-08 MED ORDER — SODIUM CHLORIDE 0.9 % IV BOLUS (SEPSIS)
500.0000 mL | INTRAVENOUS | Status: AC
  Administered 2014-06-08: 500 mL via INTRAVENOUS

## 2014-06-08 MED ORDER — DEXTROSE 5 % IV SOLN
500.0000 mg | Freq: Three times a day (TID) | INTRAVENOUS | Status: DC
  Administered 2014-06-09 – 2014-06-10 (×4): 500 mg via INTRAVENOUS
  Filled 2014-06-08 (×8): qty 0.5

## 2014-06-08 MED ORDER — VANCOMYCIN HCL IN DEXTROSE 1-5 GM/200ML-% IV SOLN
1000.0000 mg | INTRAVENOUS | Status: DC
  Filled 2014-06-08: qty 200

## 2014-06-08 MED ORDER — LEVOFLOXACIN IN D5W 750 MG/150ML IV SOLN
750.0000 mg | Freq: Once | INTRAVENOUS | Status: AC
  Administered 2014-06-08: 750 mg via INTRAVENOUS
  Filled 2014-06-08: qty 150

## 2014-06-08 MED ORDER — AZTREONAM 2 G IJ SOLR
2.0000 g | Freq: Once | INTRAMUSCULAR | Status: AC
  Administered 2014-06-08: 2 g via INTRAVENOUS
  Filled 2014-06-08: qty 2

## 2014-06-08 MED ORDER — LEVOFLOXACIN IN D5W 750 MG/150ML IV SOLN
750.0000 mg | INTRAVENOUS | Status: DC
  Filled 2014-06-08: qty 150

## 2014-06-08 MED ORDER — SODIUM CHLORIDE 0.9 % IV BOLUS (SEPSIS)
1000.0000 mL | INTRAVENOUS | Status: AC
  Administered 2014-06-08 (×2): 1000 mL via INTRAVENOUS

## 2014-06-08 NOTE — ED Notes (Signed)
[facility] is [facility]  [facility]  Staff at SNF stated to EMS that he has had a fever and might have aspirated [facility].  Patient has history of dementia.  Patient with obvious increased work of breathing.  Patient is warm to the touch.  Denies any pain.

## 2014-06-08 NOTE — ED Notes (Signed)
Dr. Jacubowitz at bedside 

## 2014-06-08 NOTE — ED Provider Notes (Signed)
CSN: [id number]     Arrival date & time 06/08/14  1937 History   First MD Initiated Contact with Patient 06/08/14 1956     Chief Complaint  Patient presents with  . Fever  . Shortness of Breath    Level V caveat dementia, noncommunicative. History is obtained from records accompany patient and from patient's wife and son who accompany him (Consider location/radiation/quality/duration/timing/severity/associated sxs/prior Treatment) HPI Patient noted to have fever today at nursing home 101.8 degrees. While eating today at 4 PM he choked on food and aspirated. Pulse oximetry dropped to 89%. Treated with supplemental oxygen. Patient regularly aspirates. Past Medical History  Diagnosis Date  . Resting tremor   . Memory loss   . Unspecified cerebral artery occlusion with cerebral infarction   . Secondary parkinsonism   . High cholesterol   . Bipolar disorder   . Hypothyroidism   . Dementia    Past Surgical History  Procedure Laterality Date  . None    . Esophagogastroduodenoscopy (egd) with propofol N/A 05/21/2014    Procedure: ESOPHAGOGASTRODUODENOSCOPY (EGD) WITH PROPOFOL with possible esophageal dilation;  Surgeon: Graylin Shiver, MD;  Location: [facility] ENDOSCOPY;  Service: Endoscopy;  Laterality: N/A;   Family History  Problem Relation Age of Onset  . Stroke Father   . Cancer Mother   . Cancer Brother   . Heart disease Sister   . Diabetes Sister    History  Substance Use Topics  . Smoking status: Never Smoker   . Smokeless tobacco: Never Used  . Alcohol Use: No    Review of Systems  Unable to perform ROS Respiratory: Positive for shortness of breath.    review of systems unobtainable due to dementia, unstable vital signs    Allergies  Penicillins  Home Medications   Prior to Admission medications   Medication Sig Start Date End Date Taking? Authorizing Provider  amLODipine (NORVASC) 5 MG tablet Take 2 tablets (10 mg total) by mouth daily. 06/07/14   Calvert Cantor, MD   divalproex (DEPAKOTE ER) 250 MG 24 hr tablet Take 250 mg by mouth daily.  10/19/12   Historical Provider, MD  donepezil (ARICEPT) 10 MG tablet Take 1 tablet (10 mg total) by mouth daily. 11/02/13   Nilda Riggs, NP  feeding supplement, ENSURE, (ENSURE) PUDG Take 1 Container by mouth 3 (three) times daily between meals. 06/07/14   Calvert Cantor, MD  finasteride (PROSCAR) 5 MG tablet Take 5 mg by mouth daily.  10/03/12   Historical Provider, MD  levothyroxine (SYNTHROID, LEVOTHROID) 50 MCG tablet Take 50 mcg by mouth daily.  10/18/12   Historical Provider, MD  MYRBETRIQ 25 MG TB24 tablet Take 25 mg by mouth daily.  07/23/13   Historical Provider, MD  omeprazole (PRILOSEC) 40 MG capsule Take 40 mg by mouth daily.  10/26/12   Historical Provider, MD  risperiDONE (RISPERDAL) 1 MG tablet Take 1 mg by mouth daily.  07/25/12   Historical Provider, MD  simvastatin (ZOCOR) 20 MG tablet Take 20 mg by mouth daily.    Historical Provider, MD  sodium bicarbonate 650 MG tablet Take 1 tablet (650 mg total) by mouth 3 (three) times daily. 05/28/14   Rodolph Bong, MD  trihexyphenidyl (ARTANE) 2 MG tablet Take 1 tablet (2 mg total) by mouth daily. 11/02/13   Nilda Riggs, NP   BP 90/64 mmHg  Pulse 113  Temp(Src) 100.7 F (38.2 C) (Rectal)  Resp 42  SpO2 94% Physical Exam  Constitutional: No distress.  Chronically and acutely Ill-appearing  HENT:  Head: Normocephalic and atraumatic.  The case member and dry  Eyes: Conjunctivae are normal. Pupils are equal, round, and reactive to light.  Neck: Neck supple. No tracheal deviation present. No thyromegaly present.  Cardiovascular: Normal rate and regular rhythm.   No murmur heard. Pulmonary/Chest:  Make, diffuse rhonchi  Abdominal: Soft. Bowel sounds are normal. He exhibits no distension. There is no tenderness.  Musculoskeletal: Normal range of motion. He exhibits no edema or tenderness.  Gross muscular atrophy all 4 extremities  Neurological: He  is alert. Coordination normal.  Is not follow simple commands  Skin: Skin is warm and dry. No rash noted.  Nursing note and vitals reviewed.   ED Course  Procedures (including critical care time) Labs Review Labs Reviewed  CULTURE, BLOOD (ROUTINE X 2)  CULTURE, BLOOD (ROUTINE X 2)  LACTIC ACID, PLASMA  CBC WITH DIFFERENTIAL  COMPREHENSIVE METABOLIC PANEL  I-STAT CG4 LACTIC ACID, ED    Imaging Review No results found.   EKG Interpretation None     Chest x-ray viewed by me Results for orders placed or performed during the hospital encounter of 06/08/14  CBC with Differential  Result Value Ref Range   WBC 27.4 (H) 4.0 - 10.5 K/uL   RBC 4.23 4.22 - 5.81 MIL/uL   Hemoglobin 12.9 (L) 13.0 - 17.0 g/dL   HCT 19.1 47.8 - 29.5 %   MCV 96.0 78.0 - 100.0 fL   MCH 30.5 26.0 - 34.0 pg   MCHC 31.8 30.0 - 36.0 g/dL   RDW 62.1 30.8 - 65.7 %   Platelets 221 150 - 400 K/uL   Neutrophils Relative % 89 (H) 43 - 77 %   Lymphocytes Relative 5 (L) 12 - 46 %   Monocytes Relative 6 3 - 12 %   Eosinophils Relative 0 0 - 5 %   Basophils Relative 0 0 - 1 %   Neutro Abs 24.4 (H) 1.7 - 7.7 K/uL   Lymphs Abs 1.4 0.7 - 4.0 K/uL   Monocytes Absolute 1.6 (H) 0.1 - 1.0 K/uL   Eosinophils Absolute 0.0 0.0 - 0.7 K/uL   Basophils Absolute 0.0 0.0 - 0.1 K/uL   RBC Morphology TEARDROP CELLS    Smear Review LARGE PLATELETS PRESENT   Comprehensive metabolic panel  Result Value Ref Range   Sodium 150 (H) 137 - 147 mEq/L   Potassium 5.2 3.7 - 5.3 mEq/L   Chloride 113 (H) 96 - 112 mEq/L   CO2 17 (L) 19 - 32 mEq/L   Glucose, Bld 177 (H) 70 - 99 mg/dL   BUN 67 (H) 6 - 23 mg/dL   Creatinine, Ser 8.46 (H) 0.50 - 1.35 mg/dL   Calcium 96.2 8.4 - 95.2 mg/dL   Total Protein 7.7 6.0 - 8.3 g/dL   Albumin 2.5 (L) 3.5 - 5.2 g/dL   AST 27 0 - 37 U/L   ALT 38 0 - 53 U/L   Alkaline Phosphatase 128 (H) 39 - 117 U/L   Total Bilirubin 0.7 0.3 - 1.2 mg/dL   GFR calc non Af Amer 13 (L) >90 mL/min   GFR calc Af Amer  15 (L) >90 mL/min   Anion gap 20 (H) 5 - 15  Urinalysis, Routine w reflex microscopic  Result Value Ref Range   Color, Urine YELLOW YELLOW   APPearance CLOUDY (A) CLEAR   Specific Gravity, Urine 1.014 1.005 - 1.030   pH 5.5 5.0 - 8.0   Glucose,  UA NEGATIVE NEGATIVE mg/dL   Hgb urine dipstick TRACE (A) NEGATIVE   Bilirubin Urine NEGATIVE NEGATIVE   Ketones, ur NEGATIVE NEGATIVE mg/dL   Protein, ur 30 (A) NEGATIVE mg/dL   Urobilinogen, UA 1.0 0.0 - 1.0 mg/dL   Nitrite NEGATIVE NEGATIVE   Leukocytes, UA MODERATE (A) NEGATIVE  Urine microscopic-add on  Result Value Ref Range   Squamous Epithelial / LPF FEW (A) RARE   WBC, UA 7-10 <3 WBC/hpf   RBC / HPF 0-2 <3 RBC/hpf   Bacteria, UA MANY (A) RARE  I-Stat CG4 Lactic Acid, ED  Result Value Ref Range   Lactic Acid, Venous 2.16 0.5 - 2.2 mmol/L  I-Stat CG4 Lactic Acid, ED  Result Value Ref Range   Lactic Acid, Venous 1.49 0.5 - 2.2 mmol/L   Ct Abdomen Pelvis Wo Contrast  05/18/2014   CLINICAL DATA:  Constipation for the past 3 days. Nausea and vomiting for the past 2 days. Worsening abdominal pain for the past 2 days.  EXAM: CT ABDOMEN AND PELVIS WITHOUT CONTRAST  TECHNIQUE: Multidetector CT imaging of the abdomen and pelvis was performed following the standard protocol without IV contrast.  COMPARISON:  None.  FINDINGS: Small sliding hiatal hernia. Calcified granulomata in the liver. Unremarkable non contrasted appearance of the spleen, pancreas, adrenal glands, urinary bladder and prostate gland.  1.2 cm gallstone in the gallbladder. No gallbladder wall thickening or pericholecystic fluid. 1.2 cm mid left renal cyst or calyceal diverticulum with dependent calcific density. 6 mm hemorrhagic lower pole left renal cyst. Both kidneys are somewhat small.  Scattered colonic diverticula without evidence of diverticulitis. No enlarged lymph nodes. Atheromatous arterial calcifications. Normal amount of stool in the colon. Mild bilateral lower lobe  cylindrical bronchiectasis. Lumbar and lower thoracic spine degenerative changes and mild scoliosis.  IMPRESSION: 1. No acute abnormality. 2. Small sliding hiatal hernia. 3. Cholelithiasis. 4. Mild bilateral lower lobe cylindrical bronchiectasis.   Electronically Signed   By: Gordan PaymentSteve  Reid M.D.   On: 05/18/2014 14:08   Dg Chest 2 View  05/19/2014   CLINICAL DATA:  Pneumonia  EXAM: CHEST  2 VIEW  COMPARISON:  01/31/2013  FINDINGS: Cardiomediastinal silhouette is stable. No acute infiltrate or pleural effusion. No pulmonary edema. Osteopenia and mild degenerative changes mid thoracic spine. Atherosclerotic calcifications of thoracic aorta.  IMPRESSION: No active cardiopulmonary disease.   Electronically Signed   By: Natasha MeadLiviu  Pop M.D.   On: 05/19/2014 16:44   Dg Abd 1 View  05/31/2014   CLINICAL DATA:  Failure to thrive.  Abdominal pain.  EXAM: ABDOMEN - 1 VIEW  COMPARISON:  05/18/2014  FINDINGS: Old healed bilateral rib fractures. Atherosclerosis noted. Bony demineralization.  Bowel gas pattern within normal limits. Scattered vascular calcifications noted.  IMPRESSION: 1. Bowel gas pattern within normal limits. 2. Atherosclerosis and scattered vascular calcifications. 3. Bony demineralization.   Electronically Signed   By: Herbie BaltimoreWalt  Liebkemann M.D.   On: 05/31/2014 19:51   [place] Abdomen Complete  05/21/2014   CLINICAL DATA:  History of gallstones ; dialysis dependent chronic renal failure  EXAM: ULTRASOUND ABDOMEN COMPLETE  COMPARISON:  CT scan of the abdomen and pelvis of May 18 2014  FINDINGS: Gallbladder: The gallbladder is adequately distended. There is a 11 mm diameter mobile mobile echogenic shadowing stone. There is no gallbladder wall thickening, pericholecystic fluid, or positive sonographic Murphy's sign.  Common bile duct: Diameter: 4.3 mm  Liver: No focal lesion identified. Within normal limits in parenchymal echogenicity.  IVC: No abnormality visualized.  Pancreas: Visualized portion unremarkable.   Spleen: Size and appearance within normal limits.  Right Kidney: Length: 10.5 cm. There is cortical thinning diffusely with increased cortical echotexture.  Left Kidney: Length: 10 cm. There is cortical thinning diffusely and increased cortical echotexture. There is a 1.9 cm simple appearing upper pole cyst.  Abdominal aorta: No aneurysm visualized.  Other findings: None.  IMPRESSION: 1. Cholelithiasis without evidence of acute cholecystitis. 2. Changes within the kidneys consistent with chronic renal failure.   Electronically Signed   By: David  SwazilandJordan   On: 05/21/2014 08:13   Dg Esophagus  05/20/2014   CLINICAL DATA:  Vomiting.  Dysphagia.  ICD10: R 13.1.  Dementia.  EXAM: ESOPHAGUS/BARIUM SWALLOW/TABLET STUDY  COMPARISON:  Chest radiograph of 05/19/2014. Abdominal pelvic CT of 05/18/2014.  FINDINGS: Exam could not be performed. Patient was somnolent, and only mildly arousable. Attempt was made to have the patient swallow contrast, without success.  IMPRESSION: Unsuccessful attempt at esophagram. Given the clinical history of dementia and dysphasia, recommend speech pathology evaluation prior to attempting repeat esophagram. Esophagram should also be withheld until the patient can follow directions.   Electronically Signed   By: Jeronimo GreavesKyle  Talbot M.D.   On: 05/20/2014 16:05   Dg Chest Port 1 View  (if Code Sepsis Called)  06/08/2014   CLINICAL DATA:  Fever and shortness of breath.  EXAM: PORTABLE CHEST - 1 VIEW  COMPARISON:  05/31/2014.  FINDINGS: Normal heart size with calcified tortuous aorta. Mild LEFT basilar atelectasis without focal infiltrate or effusion. Low lung volumes. Osteopenia. Similar appearance priors.  IMPRESSION: No definite active infiltrates.  Chronic changes as described.   Electronically Signed   By: Davonna BellingJohn  Curnes M.D.   On: 06/08/2014 20:55   Dg Chest Port 1 View  05/31/2014   CLINICAL DATA:  Altered mental status and failure to thrive for 3 days.  EXAM: PORTABLE CHEST - 1 VIEW   COMPARISON:  05/22/2014  FINDINGS: The heart is borderline enlarged but stable. There is tortuosity and calcification of the thoracic aorta. Low lung volumes with vascular crowding and atelectasis. No definite infiltrates or effusions.  IMPRESSION: Low lung volumes with vascular crowding and atelectasis. No definite infiltrates or effusions.   Electronically Signed   By: Loralie ChampagneMark  Gallerani M.D.   On: 05/31/2014 16:53   Dg Chest Port 1 View  05/22/2014   CLINICAL DATA:  Leukocytosis.  Weakness.  EXAM: PORTABLE CHEST - 1 VIEW  COMPARISON:  05/19/2014.  01/31/2013.  FINDINGS: Low volume chest. Probable superimposed interstitial pulmonary edema. Basilar atelectasis. Tortuous or ectatic thoracic aortic arch with atherosclerosis. Cardiopericardial silhouette is probably unchanged allowing for lower lung volumes.  No gross focal consolidation. Probable calcified lymph node in the RIGHT hilum.  IMPRESSION: Low volume chest with probable interstitial pulmonary edema.   Electronically Signed   By: Andreas NewportGeoffrey  Lamke M.D.   On: 05/22/2014 08:53     10:15 PM Patient is more alert and answering questions after treatment with intravenous antibiotics and intravenous fluids MDM  Code sepsis called by me based on Sirs criteria respiratory rate, pulse Pt felt to be in septic shock on arrival , which improved with treatment Patient is DO NOT RESUSCITATE CODE STATUS Plan broad-spectrum antibiotics intervenous fluid resuscitation Most form reports that he is amenable to feeding tube Spoke with Dr. Allena KatzPatel plan admit step down unit Diagnosis #1 septic shock #2 hypernatremia #3uti #4 aspiration  Final diagnoses:  None  CRITICAL CARE Performed by: Doug SouJACUBOWITZ,Melodye Swor Total critical care time: 45 minute  Critical care time was exclusive of separately billable procedures and treating other patients. Critical care was necessary to treat or prevent imminent or life-threatening deterioration. Critical care was time spent personally  by me on the following activities: development of treatment plan with patient and/or surrogate as well as nursing, discussions with consultants, evaluation of patient's response to treatment, examination of patient, obtaining history from patient or surrogate, ordering and performing treatments and interventions, ordering and review of laboratory studies, ordering and review of radiographic studies, pulse oximetry and re-evaluation of patient's condition.      Doug Sou, MD 06/08/14 2222

## 2014-06-08 NOTE — Progress Notes (Signed)
ANTIBIOTIC CONSULT NOTE - INITIAL  Pharmacy Consult for vancomcyin, levaquin, aztreonam Indication: rule out sepsis  Allergies  Allergen Reactions  . Penicillins Rash    Patient Measurements:   Adjusted Body Weight:   Vital Signs: Temp: 100.7 F (38.2 C) (12/19 1952) Temp Source: Rectal (12/19 1952) BP: 114/77 mmHg (12/19 2100) Pulse Rate: 103 (12/19 2100) Intake/Output from previous day:   Intake/Output from this shift:    Labs:  Recent Labs  06/06/14 0525 06/06/14 1855 06/08/14 2006  WBC  --   --  27.4*  HGB  --   --  12.9*  PLT  --   --  221  CREATININE 3.24* 3.02* 4.07*   Estimated Creatinine Clearance: 13.3 mL/min (by C-G formula based on Cr of 4.07). No results for input(s): VANCOTROUGH, VANCOPEAK, VANCORANDOM, GENTTROUGH, GENTPEAK, GENTRANDOM, TOBRATROUGH, TOBRAPEAK, TOBRARND, AMIKACINPEAK, AMIKACINTROU, AMIKACIN in the last 72 hours.   Microbiology: Recent Results (from the past 720 hour(s))  Culture, Urine     Status: None   Collection Time: 05/22/14 11:28 AM  Result Value Ref Range Status   Specimen Description URINE, CATHETERIZED  Final   Special Requests NONE  Final   Culture  Setup Time   Final    05/22/2014 12:29 Performed at [facility]    Colony Count NO GROWTH Performed at [facility]   Final   Culture NO GROWTH Performed at [facility]   Final   Report Status 05/23/2014 FINAL  Final  Urine culture     Status: None   Collection Time: 05/31/14  5:01 PM  Result Value Ref Range Status   Specimen Description URINE, CATHETERIZED  Final   Special Requests NONE  Final   Culture  Setup Time   Final    05/31/2014 17:25 Performed at [facility]    Colony Count NO GROWTH Performed at [facility]   Final   Culture NO GROWTH Performed at [facility]   Final   Report Status 06/01/2014 FINAL  Final  Gram stain     Status: None   Collection Time: 05/31/14  5:01 PM  Result Value Ref Range  Status   Specimen Description URINE, CATHETERIZED  Final   Special Requests NONE  Final   Gram Stain CYTOSPIN PREP NO WBC SEEN NO ORGANISMS SEEN   Final   Report Status 05/31/2014 FINAL  Final    Medical History: Past Medical History  Diagnosis Date  . Resting tremor   . Memory loss   . Unspecified cerebral artery occlusion with cerebral infarction   . Secondary parkinsonism   . High cholesterol   . Bipolar disorder   . Hypothyroidism   . Dementia    Assessment: 80 [facility] [place] [facility] [place] [facility] and possible aspiration. Tmax 100.7, scr elevated (4), wbc elevated at 27.4.   Orders to start broad abx for sepsis with vancomycin, zosyn, and aztreonam  Goal of Therapy:  Vancomycin trough level 15-20 mcg/ml  Plan:  Measure antibiotic drug levels at steady state Follow up culture results Vancomycin  1g IV now then q48 Levaquin 750mg  IV q48 hours Aztreonam 500mg  IV q8 hours  Sheppard CoilFrank Wilson PharmD., BCPS Clinical Pharmacist Pager [phone] 06/08/2014 9:27 PM

## 2014-06-08 NOTE — H&P (Signed)
[facility] History and Physical  Patient: Fernando Ruiz  ZOX:[id number]  DOB: 06/09/1931  DOS: the patient was seen and examined on 06/08/2014 PCP: Hoyle SauerAVVA,[name], MD  Chief Complaint: Shortness of breath and fever after choking on food  HPI: Fernando Ruiz is a 80 y.o. male with Past medical history of dementia, hypothyroidism, bipolar disorder, chronic kidney disease, resting tremor, suspected parkinsonism. The patient presents with complaints of a choking episode. Patient recently admitted for acute on chronic renal failure secondary to prerenal etiology due to poor oral intake and was discharged to skilled nursing facility. In the nursing facility while he was eating supper he had a choking episode and after that developed fever associated with shortness of breath and increased work of breathing. Therefore he was brought here to the hospital. Patient at the time of my evaluation denied any acute complain of denied any signs of nausea. No diarrhea or vomiting reported in the ER. No active bleeding reported in the ER. No fall no trauma reported by the nursing home.  The patient is coming from SNF And at his baseline dependent for most of his ADL.  Review of Systems: as mentioned in the history of present illness.  A Comprehensive review of the other systems is negative.  Past Medical History  Diagnosis Date  . Resting tremor   . Memory loss   . Unspecified cerebral artery occlusion with cerebral infarction   . Secondary parkinsonism   . High cholesterol   . Bipolar disorder   . Hypothyroidism   . Dementia    Past Surgical History  Procedure Laterality Date  . None    . Esophagogastroduodenoscopy (egd) with propofol N/A 05/21/2014    Procedure: ESOPHAGOGASTRODUODENOSCOPY (EGD) WITH PROPOFOL with possible esophageal dilation;  Surgeon: Graylin ShiverSalem F Ganem, MD;  [facility];  Service: Endoscopy;  Laterality: N/A;   Social History:  reports that he has never smoked.  He has never used smokeless tobacco. He reports that he does not drink alcohol or use illicit drugs.  Allergies  Allergen Reactions  . Penicillins Rash    Family History  Problem Relation Age of Onset  . Stroke Father   . Cancer Mother   . Cancer Brother   . Heart disease Sister   . Diabetes Sister     Prior to Admission medications   Medication Sig Start Date End Date Taking? Authorizing Provider  amLODipine (NORVASC) 5 MG tablet Take 2 tablets (10 mg total) by mouth daily. 06/07/14  Yes Calvert CantorSaima Rizwan, MD  clonazePAM (KLONOPIN) 1 MG tablet Take 1 mg by mouth daily.   Yes Historical Provider, MD  divalproex (DEPAKOTE ER) 250 MG 24 hr tablet Take 250 mg by mouth daily.  10/19/12  Yes Historical Provider, MD  donepezil (ARICEPT) 10 MG tablet Take 1 tablet (10 mg total) by mouth daily. 11/02/13  Yes Nilda RiggsNancy Carolyn Martin, NP  feeding supplement, ENSURE, (ENSURE) PUDG Take 1 Container by mouth 3 (three) times daily between meals. 06/07/14  Yes Calvert CantorSaima Rizwan, MD  finasteride (PROSCAR) 5 MG tablet Take 5 mg by mouth daily.  10/03/12  Yes Historical Provider, MD  levothyroxine (SYNTHROID, LEVOTHROID) 50 MCG tablet Take 50 mcg by mouth daily.  10/18/12  Yes Historical Provider, MD  MYRBETRIQ 25 MG TB24 tablet Take 25 mg by mouth daily.  07/23/13  Yes Historical Provider, MD  omeprazole (PRILOSEC) 40 MG capsule Take 40 mg by mouth daily.  10/26/12  Yes Historical Provider, MD  risperiDONE (RISPERDAL) 1 MG  tablet Take 1 mg by mouth daily.  07/25/12  Yes Historical Provider, MD  simvastatin (ZOCOR) 20 MG tablet Take 20 mg by mouth daily.   Yes Historical Provider, MD  sodium bicarbonate 650 MG tablet Take 1 tablet (650 mg total) by mouth 3 (three) times daily. 05/28/14  Yes Rodolph Bong, MD  trihexyphenidyl (ARTANE) 2 MG tablet Take 1 tablet (2 mg total) by mouth daily. 11/02/13  Yes Nilda Riggs, NP    Physical Exam: Filed Vitals:   06/08/14 2300 06/08/14 2315 06/08/14 2330 06/08/14 2347  BP:  118/72 114/66 123/72   Pulse: 91 92 90   Temp:    97.7 F (36.5 C)  TempSrc:    Axillary  Resp: 25 27 27    SpO2: 92% 93% 94%     General: Alert, Awake and follows command Appear in mild distress Eyes: PERRL ENT: Oral Mucosa clear dry. Neck: no JVD Cardiovascular: S1 and S2 Present, no Murmur, Peripheral Pulses Present Respiratory: Bilateral Air entry equal and Decreased, faint basal Crackles, no wheezes Abdomen: Bowel Sound present, Soft and non tender Skin: no Rash Extremities: no Pedal edema, no calf tenderness Neurologic: Grossly no focal neuro deficit.  Labs on Admission:  CBC:  Recent Labs Lab 06/08/14 2006  WBC 27.4*  NEUTROABS 24.4*  HGB 12.9*  HCT 40.6  MCV 96.0  PLT 221    CMP     Component Value Date/Time   NA 150* 06/08/2014 2006   K 5.2 06/08/2014 2006   CL 113* 06/08/2014 2006   CO2 17* 06/08/2014 2006   GLUCOSE 177* 06/08/2014 2006   BUN 67* 06/08/2014 2006   CREATININE 4.07* 06/08/2014 2006   CALCIUM 10.0 06/08/2014 2006   PROT 7.7 06/08/2014 2006   ALBUMIN 2.5* 06/08/2014 2006   AST 27 06/08/2014 2006   ALT 38 06/08/2014 2006   ALKPHOS 128* 06/08/2014 2006   BILITOT 0.7 06/08/2014 2006   GFRNONAA 13* 06/08/2014 2006   GFRAA 15* 06/08/2014 2006    No results for input(s): LIPASE, AMYLASE in the last 168 hours. No results for input(s): AMMONIA in the last 168 hours.  No results for input(s): CKTOTAL, CKMB, CKMBINDEX, TROPONINI in the last 168 hours. BNP (last 3 results) No results for input(s): PROBNP in the last 8760 hours.  Radiological Exams on Admission: Dg Chest Port 1 View  (if Code Sepsis Called)  06/08/2014   CLINICAL DATA:  Fever and shortness of breath.  EXAM: PORTABLE CHEST - 1 VIEW  COMPARISON:  05/31/2014.  FINDINGS: Normal heart size with calcified tortuous aorta. Mild LEFT basilar atelectasis without focal infiltrate or effusion. Low lung volumes. Osteopenia. Similar appearance priors.  IMPRESSION: No definite active  infiltrates.  Chronic changes as described.   Electronically Signed   By: Davonna Belling M.D.   On: 06/08/2014 20:55    Assessment/Plan Principal Problem:   Sepsis Active Problems:   Cerebral artery occlusion with cerebral infarction   Bipolar disorder   Hypothyroidism   Dementia   Hypernatremia   Failure to thrive in adult   Dehydration   Acute on chronic kidney failure   1. Sepsis  Suspected aspiration pneumonia The patient is presenting with complaints of shortness of breath. He was found to have fever, tachycardia, hypotension and was hypoxic requiring nonrebreather initially. Chest x-ray does not show any evidence of pneumonia but he did have an aspirated episode. Currently he is on 2 L of oxygen and his fever has improved as well as tachycardia of  initial treatment in the ER. I discussed with patient's wife and she wants us to treat the patient for his current presentation actively with maintaining DNR/DNI. With this the patient will currently being admitted in the step down unit. I would continue him with broad-spectrum antibiotics. Follow sputum culture urine antigens as well as blood cultures. He will remain nothing by mouth. Speech therapy evaluation in the morning. Patient's wife agreed for palliative care consultation and discussing with them for goals of care in the morning.  2. Hyponatremia. Patient has poor oral intake and was recently admitted for dehydration causing acute kidney injury. He continues to have acute on chronic kidney disease and sodium above 150. I would monitor BMP every 4 hours and placing him with D5 infusion.  3. History of dementia History of recurrent aspiration. History of bipolar disorder. Currently I will use IV Depakote instead of oral Depakote. There was a discussion about a feeding tube with patient and the ER physician. And mentions that the feeding tube will not prevent the patient from having aspiration and recurrent pneumonia  secondary to that, it may help nutrition. wife will discuss this with palliative care in the morning.  4. Hypothyroidism. Holding at present Synthroid  Advance goals of care discussion: DNR/DNI Wife will discuss with palliative care in morning about goals of care.   Consults: Palliative care  DVT Prophylaxis: subcutaneous Heparin Nutrition: Nothing by mouth  Family Communication: Discussed with wife on the phone, opportunity was given to ask question and all questions were answered satisfactorily at the time of interview. Disposition: Admitted toinpatient in step-down unit.  Author: Lynden OxfordPranav Jadelin Eng, MD [facility] Pager: [phone] 06/08/2014, 11:54 PM    If 7PM-7AM, please contact night-coverage [email or web address] Password [email or web address]

## 2014-06-08 NOTE — ED Notes (Signed)
Previous care handoff documented on wrong person.  Disregard.

## 2014-06-08 NOTE — Progress Notes (Signed)
[facility], report given to recieving RN.

## 2014-06-08 NOTE — ED Notes (Signed)
Attempted report.  Per Diplomatic Services operational officersecretary nurse is in a room and will call me back.

## 2014-06-09 DIAGNOSIS — G219 Secondary parkinsonism, unspecified: Secondary | ICD-10-CM

## 2014-06-09 DIAGNOSIS — E038 Other specified hypothyroidism: Secondary | ICD-10-CM

## 2014-06-09 DIAGNOSIS — A419 Sepsis, unspecified organism: Secondary | ICD-10-CM | POA: Diagnosis present

## 2014-06-09 DIAGNOSIS — E872 Acidosis: Secondary | ICD-10-CM

## 2014-06-09 DIAGNOSIS — E8729 Other acidosis: Secondary | ICD-10-CM | POA: Insufficient documentation

## 2014-06-09 LAB — BLOOD GAS, ARTERIAL
Acid-base deficit: 8.8 mmol/L — ABNORMAL HIGH (ref 0.0–2.0)
Bicarbonate: 15.6 mEq/L — ABNORMAL LOW (ref 20.0–24.0)
Drawn by: 246861
O2 Content: 4 L/min
O2 Saturation: 92.8 %
Patient temperature: 98.6
TCO2: 16.5 mmol/L (ref 0–100)
pCO2 arterial: 28.2 mmHg — ABNORMAL LOW (ref 35.0–45.0)
pH, Arterial: 7.362 (ref 7.350–7.450)
pO2, Arterial: 71 mmHg — ABNORMAL LOW (ref 80.0–100.0)

## 2014-06-09 LAB — BASIC METABOLIC PANEL
Anion gap: 15 (ref 5–15)
Anion gap: 17 — ABNORMAL HIGH (ref 5–15)
Anion gap: 15 (ref 5–15)
Anion gap: 17 — ABNORMAL HIGH (ref 5–15)
BUN: 62 mg/dL — AB (ref 6–23)
BUN: 64 mg/dL — ABNORMAL HIGH (ref 6–23)
BUN: 65 mg/dL — ABNORMAL HIGH (ref 6–23)
BUN: 66 mg/dL — ABNORMAL HIGH (ref 6–23)
CALCIUM: 8.6 mg/dL (ref 8.4–10.5)
CO2: 13 mEq/L — ABNORMAL LOW (ref 19–32)
CO2: 15 mEq/L — ABNORMAL LOW (ref 19–32)
CO2: 16 mEq/L — ABNORMAL LOW (ref 19–32)
CO2: 16 meq/L — AB (ref 19–32)
Calcium: 8.8 mg/dL (ref 8.4–10.5)
Calcium: 8.6 mg/dL (ref 8.4–10.5)
Calcium: 8.6 mg/dL (ref 8.4–10.5)
Chloride: 118 mEq/L — ABNORMAL HIGH (ref 96–112)
Chloride: 118 mEq/L — ABNORMAL HIGH (ref 96–112)
Chloride: 121 mEq/L — ABNORMAL HIGH (ref 96–112)
Chloride: 121 mEq/L — ABNORMAL HIGH (ref 96–112)
Creatinine, Ser: 3.82 mg/dL — ABNORMAL HIGH (ref 0.50–1.35)
Creatinine, Ser: 3.67 mg/dL — ABNORMAL HIGH (ref 0.50–1.35)
Creatinine, Ser: 3.68 mg/dL — ABNORMAL HIGH (ref 0.50–1.35)
Creatinine, Ser: 3.72 mg/dL — ABNORMAL HIGH (ref 0.50–1.35)
GFR calc Af Amer: 16 mL/min — ABNORMAL LOW (ref >90)
GFR calc Af Amer: 16 mL/min — ABNORMAL LOW (ref >90)
GFR calc Af Amer: 16 mL/min — ABNORMAL LOW (ref >90)
GFR calc Af Amer: 16 mL/min — ABNORMAL LOW (ref >90)
GFR calc non Af Amer: 14 mL/min — ABNORMAL LOW (ref >90)
GFR calc non Af Amer: 14 mL/min — ABNORMAL LOW (ref >90)
GFR calc non Af Amer: 14 mL/min — ABNORMAL LOW (ref >90)
GFR calc non Af Amer: 14 mL/min — ABNORMAL LOW (ref >90)
Glucose, Bld: 154 mg/dL — ABNORMAL HIGH (ref 70–99)
Glucose, Bld: 88 mg/dL (ref 70–99)
Glucose, Bld: 114 mg/dL — ABNORMAL HIGH (ref 70–99)
Glucose, Bld: 110 mg/dL — ABNORMAL HIGH (ref 70–99)
Potassium: 4.5 mEq/L (ref 3.7–5.3)
Potassium: 4.9 mEq/L (ref 3.7–5.3)
Potassium: 4.7 mEq/L (ref 3.7–5.3)
Potassium: 5 mEq/L (ref 3.7–5.3)
Sodium: 149 mEq/L — ABNORMAL HIGH (ref 137–147)
Sodium: 148 mEq/L — ABNORMAL HIGH (ref 137–147)
Sodium: 152 mEq/L — ABNORMAL HIGH (ref 137–147)
Sodium: 153 mEq/L — ABNORMAL HIGH (ref 137–147)

## 2014-06-09 LAB — CBC WITH DIFFERENTIAL/PLATELET
BASOS ABS: 0 10*3/uL (ref 0.0–0.1)
BASOS PCT: 0 % (ref 0–1)
EOS ABS: 0 10*3/uL (ref 0.0–0.7)
Eosinophils Relative: 0 % (ref 0–5)
HCT: 35 % — ABNORMAL LOW (ref 39.0–52.0)
Hemoglobin: 11 g/dL — ABNORMAL LOW (ref 13.0–17.0)
Lymphocytes Relative: 6 % — ABNORMAL LOW (ref 12–46)
Lymphs Abs: 0.8 10*3/uL (ref 0.7–4.0)
MCH: 30.9 pg (ref 26.0–34.0)
MCHC: 31.4 g/dL (ref 30.0–36.0)
MCV: 98.3 fL (ref 78.0–100.0)
MONOS PCT: 6 % (ref 3–12)
Monocytes Absolute: 0.9 10*3/uL (ref 0.1–1.0)
NEUTROS PCT: 88 % — AB (ref 43–77)
Neutro Abs: 12.7 10*3/uL — ABNORMAL HIGH (ref 1.7–7.7)
PLATELETS: 146 10*3/uL — AB (ref 150–400)
RBC: 3.56 MIL/uL — ABNORMAL LOW (ref 4.22–5.81)
RDW: 14.4 % (ref 11.5–15.5)
WBC: 14.4 10*3/uL — ABNORMAL HIGH (ref 4.0–10.5)

## 2014-06-09 LAB — STREP PNEUMONIAE URINARY ANTIGEN
STREP PNEUMO URINARY ANTIGEN: NEGATIVE
Strep Pneumo Urinary Antigen: NEGATIVE

## 2014-06-09 LAB — PROTIME-INR
INR: 1.3 (ref 0.00–1.49)
PROTHROMBIN TIME: 16.4 s — AB (ref 11.6–15.2)

## 2014-06-09 LAB — MRSA PCR SCREENING: MRSA by PCR: NEGATIVE

## 2014-06-09 MED ORDER — CETYLPYRIDINIUM CHLORIDE 0.05 % MT LIQD
7.0000 mL | Freq: Two times a day (BID) | OROMUCOSAL | Status: DC
  Administered 2014-06-09 – 2014-06-10 (×2): 7 mL via OROMUCOSAL

## 2014-06-09 MED ORDER — SODIUM BICARBONATE 8.4 % IV SOLN
Freq: Once | INTRAVENOUS | Status: AC
  Administered 2014-06-09: 22:00:00 via INTRAVENOUS
  Filled 2014-06-09 (×2): qty 850

## 2014-06-09 MED ORDER — VALPROATE SODIUM 500 MG/5ML IV SOLN
250.0000 mg | Freq: Every day | INTRAVENOUS | Status: DC
  Administered 2014-06-09 – 2014-06-12 (×4): 250 mg via INTRAVENOUS
  Filled 2014-06-09 (×4): qty 2.5

## 2014-06-09 MED ORDER — IPRATROPIUM-ALBUTEROL 0.5-2.5 (3) MG/3ML IN SOLN
3.0000 mL | Freq: Four times a day (QID) | RESPIRATORY_TRACT | Status: DC
  Administered 2014-06-09 – 2014-06-10 (×2): 3 mL via RESPIRATORY_TRACT
  Filled 2014-06-09: qty 3

## 2014-06-09 MED ORDER — METHYLPREDNISOLONE SODIUM SUCC 125 MG IJ SOLR
60.0000 mg | Freq: Every day | INTRAMUSCULAR | Status: DC
  Administered 2014-06-09: 60 mg via INTRAVENOUS
  Filled 2014-06-09: qty 0.96
  Filled 2014-06-09: qty 2

## 2014-06-09 MED ORDER — HEPARIN SODIUM (PORCINE) 5000 UNIT/ML IJ SOLN
5000.0000 [IU] | Freq: Three times a day (TID) | INTRAMUSCULAR | Status: DC
  Administered 2014-06-09 – 2014-06-10 (×4): 5000 [IU] via SUBCUTANEOUS
  Filled 2014-06-09 (×7): qty 1

## 2014-06-09 MED ORDER — DEXTROSE 5 % IV SOLN
INTRAVENOUS | Status: DC
  Administered 2014-06-09 (×2): via INTRAVENOUS
  Administered 2014-06-10: 1000 mL via INTRAVENOUS

## 2014-06-09 MED ORDER — LEVOTHYROXINE SODIUM 100 MCG IV SOLR
25.0000 ug | Freq: Every day | INTRAVENOUS | Status: DC
  Administered 2014-06-09 – 2014-06-10 (×2): 25 ug via INTRAVENOUS
  Filled 2014-06-09 (×2): qty 5

## 2014-06-09 NOTE — Progress Notes (Signed)
Pt had a temp=101 at 0700, no prn tylenol ordered. [facility] paged, awaiting a call back and on-coming rn has been made aware.----Zori Benbrook, rn

## 2014-06-09 NOTE — Progress Notes (Signed)
Pt admitted from the ed to 3s05, pt is alert to self only, has dementia, NSR on the monitor, assessment done, vital signs were stable. Will continue to monitor pt.-----Charma Mocarski, rn

## 2014-06-09 NOTE — Progress Notes (Signed)
[facility] TEAM 1 - Stepdown/ICU TEAM Progress Note  Fernando Ruiz [id number] DOB: 07/28/1930 DOA: 06/08/2014 PCP: Hoyle SauerAVVA,[name], MD  Admit HPI / Brief Narrative: Fernando Ruiz is a 80 y.o. WM  PMHx Parkinson's, bipolar disorder,dementia, hypothyroidism,  chronic kidney disease, resting tremor, suspected parkinsonism. The patient presents with complaints of a choking episode. Patient recently admitted for acute on chronic renal failure secondary to prerenal etiology due to poor oral intake and was discharged to skilled nursing facility. In the nursing facility while he was eating supper he had a choking episode and after that developed fever associated with shortness of breath and increased work of breathing. Therefore he was brought here to the hospital. Patient at the time of my evaluation denied any acute complain of denied any signs of nausea. No diarrhea or vomiting reported in the ER. No active bleeding reported in the ER. No fall no trauma reported by the nursing home.  The patient is coming from SNF And at his baseline dependent for most of his ADL.  HPI/Subjective: 12/20 somnolent, difficult to arouse however once aroused will move all extremities to command, answers all questions with "alright"  Assessment/Plan: Sepsis/aspiration pneumonia? -Nothing by mouth until patient passes swallow test -Solu-Medrol 60 mg daily -DuoNeb QID -Speech therapy evaluation in the morning. -Strep pneumo urine pending -Legionella urine pending -Respiratory virus panel pending -Patient's wife agreed for palliative care consultation and discussing with them for goals of care   Anion gap metabolic acidosis -Sodium bicarbonate in sterile water 50 ml/hr  Hypernatremia -Continue dextrose 50 ml/hr -Obtain echocardiogram to determine patient's cardiac function, will need significantly more hydration   acute on chronic kidney disease . -Baseline Cr~3.18-4.67  - current  Cr slightly worse  than baseline   Dementia -Unsure of baseline; no family available  -Aricept 10 mg daily when patient passes swallow test  History of bipolar disorder. -Depakote IV 250 mg daily -Obtain Depakote level.  Parkinson's -Artane 2 mg daily when patient passes swallow test  Hypothyroidism. -Obtain TSH level -Use half of patient's home dose Synthroid; Synthroid IV 25 g daily     Code Status: FULL Family Communication: no family present at time of exam Disposition Plan: SNF vs Pallitive Care   Consultants: NA  Procedure/Significant Events: 12/19 blood 2 pending 12/19 urine pending 12/20 MRSA by PCR negative   Culture   Antibiotics: Aztreonam 12/20>> Levofloxacin 12/21>> Vancomycin 12/21>>   DVT prophylaxis: Heparin subcutaneous   Devices    LINES / TUBES:      Continuous Infusions: . dextrose 75 mL/hr at 06/09/14 0030    Objective: VITAL SIGNS: Temp: 101 F (38.3 C) (12/20 0711) Temp Source: Rectal (12/20 0711) BP: 139/90 mmHg (12/20 0711) Pulse Rate: 106 (12/20 0711) SPO2; FIO2:   Intake/Output Summary (Last 24 hours) at 06/09/14 1228 Last data filed at 06/09/14 [id number]  Gross per 24 hour  Intake 2612.5 ml  Output    975 ml  Net 1637.5 ml     Exam: General: Somnolent, difficult to arouse, however once aroused will move all extremities to commands, answers all questions with "alright", No acute respiratory distress Lungs: Clear to auscultation bilaterally without wheezes or crackles Cardiovascular: Regular rate and rhythm without murmur gallop or rub normal S1 and S2 Abdomen: Nontender, nondistended, soft, bowel sounds positive, no rebound, no ascites, no appreciable mass Extremities: No significant cyanosis, clubbing, or edema bilateral lower extremities  Data Reviewed: Basic Metabolic Panel:  Recent Labs Lab 06/06/14 1855 06/08/14 2006 06/09/14 0050  06/09/14 0400 06/09/14 0736  NA 146 150* 149* 148* 152*  K 5.0 5.2 4.5 4.9 4.7    CL 112 113* 118* 118* 121*  CO2 18* 17* 16* 13* 16*  GLUCOSE 119* 177* 154* 88 114*  BUN 35* 67* 62* 64* 65*  CREATININE 3.02* 4.07* 3.82* 3.67* 3.68*  CALCIUM 9.4 10.0 8.6 8.8 8.6   Liver Function Tests:  Recent Labs Lab 06/08/14 2006  AST 27  ALT 38  ALKPHOS 128*  BILITOT 0.7  PROT 7.7  ALBUMIN 2.5*   No results for input(s): LIPASE, AMYLASE in the last 168 hours. No results for input(s): AMMONIA in the last 168 hours. CBC:  Recent Labs Lab 06/08/14 2006 06/09/14 0736  WBC 27.4* 14.4*  NEUTROABS 24.4* 12.7*  HGB 12.9* 11.0*  HCT 40.6 35.0*  MCV 96.0 98.3  PLT 221 146*   Cardiac Enzymes: No results for input(s): CKTOTAL, CKMB, CKMBINDEX, TROPONINI in the last 168 hours. BNP (last 3 results) No results for input(s): PROBNP in the last 8760 hours. CBG: No results for input(s): GLUCAP in the last 168 hours.  Recent Results (from the past 240 hour(s))  Urine culture     Status: None   Collection Time: 05/31/14  5:01 PM  Result Value Ref Range Status   Specimen Description URINE, CATHETERIZED  Final   Special Requests NONE  Final   Culture  Setup Time   Final    05/31/2014 17:25 Performed at [facility]    Colony Count NO GROWTH Performed at [facility]   Final   Culture NO GROWTH Performed at [facility]   Final   Report Status 06/01/2014 FINAL  Final  Gram stain     Status: None   Collection Time: 05/31/14  5:01 PM  Result Value Ref Range Status   Specimen Description URINE, CATHETERIZED  Final   Special Requests NONE  Final   Gram Stain CYTOSPIN PREP NO WBC SEEN NO ORGANISMS SEEN   Final   Report Status 05/31/2014 FINAL  Final  MRSA PCR Screening     Status: None   Collection Time: 06/09/14 12:43 AM  Result Value Ref Range Status   MRSA by PCR NEGATIVE NEGATIVE Final    Comment:        The GeneXpert MRSA Assay (FDA approved for NASAL specimens only), is one component of a comprehensive MRSA  colonization surveillance program. It is not intended to diagnose MRSA infection nor to guide or monitor treatment for MRSA infections.      Studies:  Recent x-ray studies have been reviewed in detail by the Attending Physician  Scheduled Meds:  Scheduled Meds: . aztreonam  500 mg Intravenous 3 times per day  . heparin  5,000 Units Subcutaneous 3 times per day  . [START ON 06/10/2014] levofloxacin (LEVAQUIN) IV  750 mg Intravenous Q48H  . [START ON 06/10/2014] vancomycin  1,000 mg Intravenous Q48H    Time spent on care of this patient: 40 mins   Drema DallasWOODS, Racquelle Hyser, J , MD   [facility] Office  [phone] Pager - [phone]  On-Call/Text Page:      [email or web address]      password [email or web address]  If 7PM-7AM, please contact night-coverage [email or web address] Password [email or web address] 06/09/2014, 12:28 PM   LOS: 1 day

## 2014-06-10 DIAGNOSIS — R0600 Dyspnea, unspecified: Secondary | ICD-10-CM

## 2014-06-10 DIAGNOSIS — Z515 Encounter for palliative care: Secondary | ICD-10-CM

## 2014-06-10 DIAGNOSIS — I519 Heart disease, unspecified: Secondary | ICD-10-CM

## 2014-06-10 DIAGNOSIS — R638 Other symptoms and signs concerning food and fluid intake: Secondary | ICD-10-CM

## 2014-06-10 LAB — CBC WITH DIFFERENTIAL/PLATELET
BASOS ABS: 0 10*3/uL (ref 0.0–0.1)
Basophils Relative: 0 % (ref 0–1)
Eosinophils Absolute: 0 10*3/uL (ref 0.0–0.7)
Eosinophils Relative: 0 % (ref 0–5)
HEMATOCRIT: 33.2 % — AB (ref 39.0–52.0)
Hemoglobin: 10.3 g/dL — ABNORMAL LOW (ref 13.0–17.0)
LYMPHS PCT: 3 % — AB (ref 12–46)
Lymphs Abs: 0.4 10*3/uL — ABNORMAL LOW (ref 0.7–4.0)
MCH: 30.3 pg (ref 26.0–34.0)
MCHC: 31 g/dL (ref 30.0–36.0)
MCV: 97.6 fL (ref 78.0–100.0)
Monocytes Absolute: 0.3 10*3/uL (ref 0.1–1.0)
Monocytes Relative: 2 % — ABNORMAL LOW (ref 3–12)
NEUTROS ABS: 12.2 10*3/uL — AB (ref 1.7–7.7)
Neutrophils Relative %: 95 % — ABNORMAL HIGH (ref 43–77)
Platelets: 162 10*3/uL (ref 150–400)
RBC: 3.4 MIL/uL — ABNORMAL LOW (ref 4.22–5.81)
RDW: 14.7 % (ref 11.5–15.5)
WBC: 12.8 10*3/uL — AB (ref 4.0–10.5)

## 2014-06-10 LAB — COMPREHENSIVE METABOLIC PANEL
ALK PHOS: 102 U/L (ref 39–117)
ALT: 30 U/L (ref 0–53)
AST: 21 U/L (ref 0–37)
Albumin: 1.9 g/dL — ABNORMAL LOW (ref 3.5–5.2)
Anion gap: 16 — ABNORMAL HIGH (ref 5–15)
BUN: 72 mg/dL — ABNORMAL HIGH (ref 6–23)
CALCIUM: 8.7 mg/dL (ref 8.4–10.5)
CO2: 17 mEq/L — ABNORMAL LOW (ref 19–32)
Chloride: 121 mEq/L — ABNORMAL HIGH (ref 96–112)
Creatinine, Ser: 3.67 mg/dL — ABNORMAL HIGH (ref 0.50–1.35)
GFR calc non Af Amer: 14 mL/min — ABNORMAL LOW (ref >90)
GFR, EST AFRICAN AMERICAN: 16 mL/min — AB (ref >90)
GLUCOSE: 128 mg/dL — AB (ref 70–99)
POTASSIUM: 4.8 meq/L (ref 3.7–5.3)
SODIUM: 154 meq/L — AB (ref 137–147)
Total Bilirubin: 0.2 mg/dL — ABNORMAL LOW (ref 0.3–1.2)
Total Protein: 6.5 g/dL (ref 6.0–8.3)

## 2014-06-10 LAB — MAGNESIUM: Magnesium: 2.4 mg/dL (ref 1.5–2.5)

## 2014-06-10 LAB — TSH: TSH: 1.12 u[IU]/mL (ref 0.350–4.500)

## 2014-06-10 LAB — VALPROIC ACID LEVEL: Valproic Acid Lvl: 19.7 ug/mL — ABNORMAL LOW (ref 50.0–100.0)

## 2014-06-10 MED ORDER — CETYLPYRIDINIUM CHLORIDE 0.05 % MT LIQD: 7.0000 mL | OROMUCOSAL | Status: DC | PRN

## 2014-06-10 MED ORDER — BISACODYL 10 MG RE SUPP: 10.0000 mg | Freq: Every day | RECTAL | Status: DC | PRN

## 2014-06-10 MED ORDER — IPRATROPIUM-ALBUTEROL 0.5-2.5 (3) MG/3ML IN SOLN
3.0000 mL | Freq: Three times a day (TID) | RESPIRATORY_TRACT | Status: DC
  Administered 2014-06-10: 3 mL via RESPIRATORY_TRACT
  Filled 2014-06-10: qty 3

## 2014-06-10 MED ORDER — LORAZEPAM 2 MG/ML IJ SOLN: 0.5000 mg | INTRAMUSCULAR | Status: DC | PRN

## 2014-06-10 MED ORDER — LEVOFLOXACIN IN D5W 500 MG/100ML IV SOLN
500.0000 mg | INTRAVENOUS | Status: DC
  Filled 2014-06-10: qty 100

## 2014-06-10 MED ORDER — IPRATROPIUM-ALBUTEROL 0.5-2.5 (3) MG/3ML IN SOLN: 3.0000 mL | RESPIRATORY_TRACT | Status: DC | PRN

## 2014-06-10 MED ORDER — FENTANYL CITRATE 0.05 MG/ML IJ SOLN
25.0000 ug | INTRAMUSCULAR | Status: DC | PRN
  Administered 2014-06-11: 25 ug via INTRAVENOUS
  Filled 2014-06-10: qty 2

## 2014-06-10 MED ORDER — ATROPINE SULFATE 1 % OP SOLN
4.0000 [drp] | OPHTHALMIC | Status: DC | PRN
  Filled 2014-06-10: qty 2

## 2014-06-10 NOTE — Progress Notes (Signed)
Full note to follow:   I have spoken with [name] who tells me that she has confirmed with her son and they wish to transition to full comfort and she tells me that [facility]. They have decided that he has not been able to truly live and they do not want to continue his suffering. She tells me they are prepared for "the end." Discussed likelihood of prognosis of days with his aspiration and no intake. Updated Junious SilkAllison Ellis, NP and CSW.   Yong ChannelAlicia Kevon Tench, NP Palliative Medicine Team Pager # [phone] (M-F 8a-5p) Team Phone # [phone] (Nights/Weekends)

## 2014-06-10 NOTE — Progress Notes (Signed)
Nutrition Brief Note  Chart reviewed. Pt now transitioning to comfort care.  No nutrition interventions warranted at this time.  Please consult as needed.   Kimberly Harris, RD, LDN, CNSC Pager [phone] After Hours Pager [phone]    

## 2014-06-10 NOTE — Progress Notes (Signed)
Utilization review complete. Tykisha Areola RN CCM Case Mgmt phone [phone] 

## 2014-06-10 NOTE — Consult Note (Signed)
Patient IH:KVQQ:Fernando Ruiz      DOB: 01/07/1931      VZD:[id number]     Consult [facility] Medicine [facility]    Consult Requested by: Dr. Joseph ArtWoods     PCP: Hoyle SauerAVVA,[name], MD Reason for Consultation: GOC     Phone [phone]  Assessment of patients Current state: I have spoken with Fernando Ruiz who tells me that she has confirmed with her son and they wish to transition to full comfort and she tells me that [facility]. They have decided that he has not been able to truly live and they do not want to continue his suffering. She tells me they are prepared for "the end." Discussed likelihood of prognosis of days with his aspiration and no intake. Updated Fernando SilkAllison Ellis, NP and CSW. Aspiration and dehydration with kidney injury will lead to poor prognosis of likely days with comfort care.    Goals of Care: 1.  Code Status: DNR   2. Scope of Treatment: Comfort care.    4. Disposition: [facility].    3. Symptom Management:   1. Decreased intake/appetite: Comfort feeds are appropriate.  2. Anxiety/Agitation: Lorazepam 0.5 mg every 4 hours prn.  3. Pain/dyspnea: Fentanyl 25-50 mcg every hour prn.  4. Bowel Regimen: Dulcolax supp daily prn.  5. Terminal Secretions: Atropine SL every 4 hours prn. May add scopolamine patch as well if needed.   4. Psychosocial: Emotional support provided to patient and then to wife via telephone.    Brief HPI: 80 yo male admitted with shortness of breath after choking on food. He has Parkinson's, dementia, bipolar, CKD. Also admitted with acute on chronic kidney injury d/t poor intake.    ROS: Unable to assess - confusion.     PMH:  Past Medical History  Diagnosis Date  . Resting tremor   . Memory loss   . Unspecified cerebral artery occlusion with cerebral infarction   . Secondary parkinsonism   . High cholesterol   . Bipolar disorder   . Hypothyroidism    . Dementia      PSH: Past Surgical History  Procedure Laterality Date  . None    . Esophagogastroduodenoscopy (egd) with propofol N/A 05/21/2014    Procedure: ESOPHAGOGASTRODUODENOSCOPY (EGD) WITH PROPOFOL with possible esophageal dilation;  Surgeon: Graylin ShiverSalem F Ganem, MD;  [facility];  Service: Endoscopy;  Laterality: N/A;   I have reviewed the FH and SH and  If appropriate update it with new information. Allergies  Allergen Reactions  . Penicillins Rash   Scheduled Meds: . antiseptic oral rinse  7 mL Mouth Rinse BID  . ipratropium-albuterol  3 mL Nebulization TID  . valproate sodium  250 mg Intravenous Daily   Continuous Infusions: . dextrose 125 mL/hr at 06/10/14 1200   PRN Meds:.atropine, fentaNYL, LORazepam    BP 153/78 mmHg  Pulse 80  Temp(Src) 98.4 F (36.9 C) (Oral)  Resp 15  Wt 65.4 kg (144 lb 2.9 oz)  SpO2 95%   PPS: 20% at best   Intake/Output Summary (Last 24 hours) at 06/10/14 1337 Last data filed at 06/10/14 1200  Gross per 24 hour  Intake 2753.75 ml  Output   2175 ml  Net 578.75 ml   LBM: 06/08/14  Physical Exam:  General: NAD, chronically ill appearing, thin, frail HEENT: Temporal muscle wasting, no JVD, moist mucous membranes Chest: Rhonchi, no labored breathing currently, symmetric  CVS: RRR, S1 S2 Abdomen: Soft, NT, ND, +BS Ext: MAE, no edema, warm to touch Neuro: Awake, alert, oriented to person only  Labs: CBC    Component Value Date/Time   WBC 12.8* 06/10/2014 0255   RBC 3.40* 06/10/2014 0255   HGB 10.3* 06/10/2014 0255   HCT 33.2* 06/10/2014 0255   PLT 162 06/10/2014 0255   MCV 97.6 06/10/2014 0255   MCH 30.3 06/10/2014 0255   MCHC 31.0 06/10/2014 0255   RDW 14.7 06/10/2014 0255   LYMPHSABS 0.4* 06/10/2014 0255   MONOABS 0.3 06/10/2014 0255   EOSABS 0.0 06/10/2014 0255   BASOSABS 0.0 06/10/2014 0255    BMET    Component Value Date/Time   NA 154* 06/10/2014 0255   K 4.8 06/10/2014 0255   CL 121* 06/10/2014  0255   CO2 17* 06/10/2014 0255   GLUCOSE 128* 06/10/2014 0255   BUN 72* 06/10/2014 0255   CREATININE 3.67* 06/10/2014 0255   CALCIUM 8.7 06/10/2014 0255   GFRNONAA 14* 06/10/2014 0255   GFRAA 16* 06/10/2014 0255    CMP     Component Value Date/Time   NA 154* 06/10/2014 0255   K 4.8 06/10/2014 0255   CL 121* 06/10/2014 0255   CO2 17* 06/10/2014 0255   GLUCOSE 128* 06/10/2014 0255   BUN 72* 06/10/2014 0255   CREATININE 3.67* 06/10/2014 0255   CALCIUM 8.7 06/10/2014 0255   PROT 6.5 06/10/2014 0255   ALBUMIN 1.9* 06/10/2014 0255   AST 21 06/10/2014 0255   ALT 30 06/10/2014 0255   ALKPHOS 102 06/10/2014 0255   BILITOT 0.2* 06/10/2014 0255   GFRNONAA 14* 06/10/2014 0255   GFRAA 16* 06/10/2014 0255     Time In Time Out Total Time Spent with Patient Total Overall Time  1250 1335 35min 45min    Greater than 50%  of this time was spent counseling and coordinating care related to the above assessment and plan.  Yong ChannelAlicia Ark Agrusa, NP Palliative Medicine Team Pager # [phone] (M-F 8a-5p) Team Phone # [phone] (Nights/Weekends)

## 2014-06-10 NOTE — Progress Notes (Signed)
CARE MANAGEMENT NOTE 06/10/2014  Patient:  Fernando Ruiz,Fernando Ruiz   Account Number:  [id number]  Date Initiated:  06/10/2014  Documentation initiated by:  [facility]Ji Feldner  Subjective/Objective Assessment:   aspiration     Action/Plan:   from [facility]   Anticipated DC Date:     Anticipated DC Plan:  SKILLED NURSING FACILITY  In-house referral  Clinical Social Worker      DC Planning Services  CM consult      Choice offered to / List presented to:             Status of service:  In process, will continue to follow Medicare Important Message given?   (If response is "NO", the following Medicare IM given date fields will be blank) Date Medicare IM given:   Medicare IM given by:   Date Additional Medicare IM given:   Additional Medicare IM given by:    Discharge Disposition:    Per UR Regulation:  Reviewed for med. necessity/level of care/duration of stay  If discussed at Long Length of Stay Meetings, dates discussed:    Comments:  06/10/2014 1120 Pt is from SNF, [facility]. Waiting final recommendations for dc. Isidoro DonningAlesia Gracie Gupta RN CCM Case Mgmt phone [phone]

## 2014-06-10 NOTE — Progress Notes (Signed)
Pt did have 5 beats of svt, asymptomatic, vital signs were stable, on-call notified, no orders received. Will continue to monitor pt.-----Adarian Bur, rn

## 2014-06-10 NOTE — Progress Notes (Signed)
  Echocardiogram 2D Echocardiogram has been performed.  Fernando Ruiz 06/10/2014, 10:49 AM

## 2014-06-10 NOTE — Plan of Care (Signed)
Problem: Phase I Progression Outcomes Goal: Voiding-avoid urinary catheter unless indicated Outcome: Completed/Met Date Met:  06/10/14 Pt is incontinent, condom cath applied

## 2014-06-10 NOTE — Progress Notes (Addendum)
[facility] TEAM 1 - Stepdown/ICU TEAM Progress Note  Fernando Ruiz AVW:[id number] DOB: 04/09/1931 DOA: 06/08/2014 PCP: Hoyle SauerAVVA,[name], MD  Admit HPI / Brief Narrative: 80 year old male patient with known Parkinson's disease, bipolar disorder, dementia, hypothyroidism, and chronic kidney disease who presented to the ER w/ complaints of a choking episode. Patient had been admitted to the hospital 4 times since the end of November of this year. Most recent admission for acute on chronic renal failure secondary to dehydration from poor oral intake.  In the ER patient was found to have a white count of 27,400. Urinalysis was abnormal and consistent with possible UTI and dehydration. Lactic acid was elevated at 2.16 and his sodium was elevated at 150. Chest x-ray showed no active infiltrates.  Since admission urine culture has returned positive for enterococcus and sensitivities are pending. Patient has awakened and appears to be at his baseline. Palliative care has evaluated the patient and has had a discussion with the patient's family and at this time they wish to transition to full comfort and they would like for [facility] to be at [facility] if at all possible. They feel at this point that he is suffering and they are prepared for "the end".  HPI/Subjective: Patient alert and pleasant. Confused. Requesting something to eat.  Assessment/Plan:  Sepsis secondary to enterococcus urinary tract infection -Sepsis physiology has resolved -all antibiotics have been discontinued given family request to focus on comfort  Anion gap metabolic acidosis -Bicarbonate infusion utilized but now comfort care so this medication as been discontinued  Hypernatremia -Sodium has actually gone up so earlier today dextrose infusion increased 125 mL per hour -Given desire to focus on comfort consider decreasing IV fluid rate   Acute on chronic kidney disease with metabolic acidosis  -Recurrent and likely related  to underlying Parkinson's disease with dementia -Focus now on comfort -will not check any further lab data  Dementia -See above regarding comfort measure  History of bipolar disorder -Currently on IV Depakote-consider transition to oral since this medication could be considered a comfort medicine  Parkinson's -Consider resuming home medications if patient is able to swallow  Hypothyroidism -TSH normal  Code Status: FULL Family Communication: no family present at time of exam Disposition Plan: Anticipate hospice discharge [facility] once bed available  Consultants: Palliative medicine  Antibiotics: Aztreonam 12/19 > 12/20 Levofloxacin 12/19 Vancomycin 12/19  DVT prophylaxis: Comfort focus only   Objective: Blood pressure 144/88, pulse 94, temperature 98 F (36.7 C), temperature source Oral, resp. rate 15, weight 65.4 kg (144 lb 2.9 oz), SpO2 97 %.  Intake/Output Summary (Last 24 hours) at 06/10/14 1511 Last data filed at 06/10/14 1439  Gross per 24 hour  Intake 2577.5 ml  Output   1750 ml  Net  827.5 ml   Exam: General: Awake, confused but pleasant Lungs: Clear to auscultation bilaterally without wheezes or crackles, relievers Cardiovascular: Regular rate and rhythm, normal S1 and S2 Abdomen: Nontender, nondistended, soft, bowel sounds positive, no rebound, no ascites, no appreciable mass Extremities: No cyanosis, clubbing, or edema bilateral lower extremities  Data Reviewed: Basic Metabolic Panel:  Recent Labs Lab 06/09/14 0050 06/09/14 0400 06/09/14 0736 06/09/14 1143 06/10/14 0255  NA 149* 148* 152* 153* 154*  K 4.5 4.9 4.7 5.0 4.8  CL 118* 118* 121* 121* 121*  CO2 16* 13* 16* 15* 17*  GLUCOSE 154* 88 114* 110* 128*  BUN 62* 64* 65* 66* 72*  CREATININE 3.82* 3.67* 3.68* 3.72* 3.67*  CALCIUM 8.6 8.8 8.6 8.6 8.7  MG  --   --   --   --  2.4   Liver Function Tests:  Recent Labs Lab 06/08/14 2006 06/10/14 0255  AST 27 21  ALT 38 30    ALKPHOS 128* 102  BILITOT 0.7 0.2*  PROT 7.7 6.5  ALBUMIN 2.5* 1.9*   CBC:  Recent Labs Lab 06/08/14 2006 06/09/14 0736 06/10/14 0255  WBC 27.4* 14.4* 12.8*  NEUTROABS 24.4* 12.7* 12.2*  HGB 12.9* 11.0* 10.3*  HCT 40.6 35.0* 33.2*  MCV 96.0 98.3 97.6  PLT 221 146* 162    Recent Results (from the past 240 hour(s))  Urine culture     Status: None   Collection Time: 05/31/14  5:01 PM  Result Value Ref Range Status   Specimen Description URINE, CATHETERIZED  Final   Special Requests NONE  Final   Culture  Setup Time   Final    05/31/2014 17:25 Performed at [facility]    Colony Count NO GROWTH Performed at [facility]   Final   Culture NO GROWTH Performed at [facility]   Final   Report Status 06/01/2014 FINAL  Final  Gram stain     Status: None   Collection Time: 05/31/14  5:01 PM  Result Value Ref Range Status   Specimen Description URINE, CATHETERIZED  Final   Special Requests NONE  Final   Gram Stain CYTOSPIN PREP NO WBC SEEN NO ORGANISMS SEEN   Final   Report Status 05/31/2014 FINAL  Final  Culture, blood (routine x 2)     Status: None (Preliminary result)   Collection Time: 06/08/14  8:06 PM  Result Value Ref Range Status   Specimen Description BLOOD RIGHT FOREARM  Final   Special Requests BOTTLES DRAWN AEROBIC AND ANAEROBIC 5CC EA  Final   Culture  Setup Time   Final    06/09/2014 04:14 Performed at [facility]    Culture   Final           BLOOD CULTURE RECEIVED NO GROWTH TO DATE CULTURE WILL BE HELD FOR 5 DAYS BEFORE ISSUING A FINAL NEGATIVE REPORT Performed at [facility]    Report Status PENDING  Incomplete  Culture, blood (routine x 2)     Status: None (Preliminary result)   Collection Time: 06/08/14  8:15 PM  Result Value Ref Range Status   Specimen Description BLOOD LEFT ARM  Final   Special Requests BOTTLES DRAWN AEROBIC ONLY 10CC  Final   Culture  Setup Time   Final    06/09/2014  04:14 Performed at [facility]    Culture   Final           BLOOD CULTURE RECEIVED NO GROWTH TO DATE CULTURE WILL BE HELD FOR 5 DAYS BEFORE ISSUING A FINAL NEGATIVE REPORT Performed at [facility]    Report Status PENDING  Incomplete  Urine culture     Status: None (Preliminary result)   Collection Time: 06/08/14  9:25 PM  Result Value Ref Range Status   Specimen Description URINE, CATHETERIZED  Final   Special Requests NONE  Final   Culture  Setup Time   Final    06/09/2014 05:45 Performed at [facility] Count   Final    >=100,000 COLONIES/ML Performed at [facility]    Culture   Final    ENTEROCOCCUS SPECIES Performed at [facility]  Report Status PENDING  Incomplete  MRSA PCR Screening     Status: None   Collection Time: 06/09/14 12:43 AM  Result Value Ref Range Status   MRSA by PCR NEGATIVE NEGATIVE Final    Comment:        The GeneXpert MRSA Assay (FDA approved for NASAL specimens only), is one component of a comprehensive MRSA colonization surveillance program. It is not intended to diagnose MRSA infection nor to guide or monitor treatment for MRSA infections.      Studies:  Recent x-ray studies have been reviewed in detail by the Attending Physician  Scheduled Meds:  Scheduled Meds: . antiseptic oral rinse  7 mL Mouth Rinse BID  . ipratropium-albuterol  3 mL Nebulization TID  . valproate sodium  250 mg Intravenous Daily    Time spent on care of this patient: 35 mins   ELLIS,ALLISON L. , ANP   [facility] Office  [phone] Pager - [phone]  On-Call/Text Page:      [email or web address]      password [email or web address]  If 7PM-7AM, please contact night-coverage [email or web address] Password [email or web address] 06/10/2014, 3:11 PM   LOS: 2 days   I have personally examined this patient and reviewed the entire database. I have reviewed the above note, made any necessary editorial changes, and agree with its  content.  Lonia BloodJeffrey T. Amarisa Wilinski, MD [facility]

## 2014-06-10 NOTE — Progress Notes (Signed)
ANTIBIOTIC CONSULT NOTE - INITIAL  Pharmacy Consult for vancomcyin, levaquin, aztreonam Indication: rule out sepsis  Allergies  Allergen Reactions  . Penicillins Rash    Patient Measurements: Weight: 144 lb 2.9 oz (65.4 kg)  Vital Signs: Temp: 98.4 F (36.9 C) (12/21 0733) Temp Source: Oral (12/21 0733) BP: 153/78 mmHg (12/21 0733) Pulse Rate: 80 (12/21 0735) Intake/Output from previous day: 12/20 0701 - 12/21 0700 In: 2128.8 [I.V.:1876.3; IV Piggyback:252.5] Out: 2825 [Urine:2825] Intake/Output from this shift: Total I/O In: 390 [P.O.:240; I.V.:150] Out: -   Labs:  Recent Labs  06/08/14 2006  06/09/14 0736 06/09/14 1143 06/10/14 0255  WBC 27.4*  --  14.4*  --  12.8*  HGB 12.9*  --  11.0*  --  10.3*  PLT 221  --  146*  --  162  CREATININE 4.07*  < > 3.68* 3.72* 3.67*  < > = values in this interval not displayed. Estimated Creatinine Clearance: 14.6 mL/min (by C-G formula based on Cr of 3.67). No results for input(s): VANCOTROUGH, VANCOPEAK, VANCORANDOM, GENTTROUGH, GENTPEAK, GENTRANDOM, TOBRATROUGH, TOBRAPEAK, TOBRARND, AMIKACINPEAK, AMIKACINTROU, AMIKACIN in the last 72 hours.   Microbiology: Recent Results (from the past 720 hour(s))  Culture, Urine     Status: None   Collection Time: 05/22/14 11:28 AM  Result Value Ref Range Status   Specimen Description URINE, CATHETERIZED  Final   Special Requests NONE  Final   Culture  Setup Time   Final    05/22/2014 12:29 Performed at [facility]    Colony Count NO GROWTH Performed at [facility]   Final   Culture NO GROWTH Performed at [facility]   Final   Report Status 05/23/2014 FINAL  Final  Urine culture     Status: None   Collection Time: 05/31/14  5:01 PM  Result Value Ref Range Status   Specimen Description URINE, CATHETERIZED  Final   Special Requests NONE  Final   Culture  Setup Time   Final    05/31/2014 17:25 Performed at [facility]    Colony Count NO  GROWTH Performed at [facility]   Final   Culture NO GROWTH Performed at [facility]   Final   Report Status 06/01/2014 FINAL  Final  Gram stain     Status: None   Collection Time: 05/31/14  5:01 PM  Result Value Ref Range Status   Specimen Description URINE, CATHETERIZED  Final   Special Requests NONE  Final   Gram Stain CYTOSPIN PREP NO WBC SEEN NO ORGANISMS SEEN   Final   Report Status 05/31/2014 FINAL  Final  Culture, blood (routine x 2)     Status: None (Preliminary result)   Collection Time: 06/08/14  8:06 PM  Result Value Ref Range Status   Specimen Description BLOOD RIGHT FOREARM  Final   Special Requests BOTTLES DRAWN AEROBIC AND ANAEROBIC 5CC EA  Final   Culture  Setup Time   Final    06/09/2014 04:14 Performed at [facility]    Culture   Final           BLOOD CULTURE RECEIVED NO GROWTH TO DATE CULTURE WILL BE HELD FOR 5 DAYS BEFORE ISSUING A FINAL NEGATIVE REPORT Performed at [facility]    Report Status PENDING  Incomplete  Culture, blood (routine x 2)     Status: None (Preliminary result)   Collection Time: 06/08/14  8:15 PM  Result Value Ref Range Status   Specimen  Description BLOOD LEFT ARM  Final   Special Requests BOTTLES DRAWN AEROBIC ONLY 10CC  Final   Culture  Setup Time   Final    06/09/2014 04:14 Performed at [facility]    Culture   Final           BLOOD CULTURE RECEIVED NO GROWTH TO DATE CULTURE WILL BE HELD FOR 5 DAYS BEFORE ISSUING A FINAL NEGATIVE REPORT Performed at [facility]    Report Status PENDING  Incomplete  MRSA PCR Screening     Status: None   Collection Time: 06/09/14 12:43 AM  Result Value Ref Range Status   MRSA by PCR NEGATIVE NEGATIVE Final    Comment:        The GeneXpert MRSA Assay (FDA approved for NASAL specimens only), is one component of a comprehensive MRSA colonization surveillance program. It is not intended to diagnose MRSA infection nor to guide  or monitor treatment for MRSA infections.    Assessment: 80 [place] [facility] with fever and possible aspiration. WBC has now trended down to 12.8, Tmax/24h 101. SCr remains elevated at 3.67 with est CrCl ~2215mL/min. No growth on cultures so far.   Goal of Therapy:  Vancomycin trough level 15-20 mcg/ml  Plan:  1. Vancomycin 1g IV q48h 2. Change Levaquin to 500mg  IV q48h with CrCl ~6215mL/min 3. Aztreonam 500mg  IV q8h 4. Monitor c/s, clinical progression, renal function, may need random vancomycin level if worsens  Adelle Zachar D. Jhanvi Drakeford, PharmD, BCPS Clinical Pharmacist Pager: [phone] 06/10/2014 11:15 AM

## 2014-06-10 NOTE — Clinical Social Work Psychosocial (Signed)
Clinical Social Work Department BRIEF PSYCHOSOCIAL ASSESSMENT 06/10/2014  Patient:  Fernando Ruiz, Fernando Ruiz     Account Number:  [id number]     Admit date:  06/08/2014  Clinical Social Worker:  Marciano Sequin  Date/Time:  06/10/2014 01:50 PM  Referred by:  RN  Date Referred:  06/10/2014 Referred for  Residential hospice placement   Other Referral:   Interview type:  Family Other interview type:   Pt is oriented to self only: Fernando Ruiz,Fernando Ruiz Spouse [phone]    PSYCHOSOCIAL DATA Living Status:  WIFE Admitted from facility:   Level of care:   Primary support name:  Ruiz,Fernando  Primary support relationship to patient:  SPOUSE Degree of support available:   Strong Support    CURRENT CONCERNS Current Concerns  None Noted   Other Concerns:    SOCIAL WORK ASSESSMENT / PLAN CSW met pt's wife Fernando Ruiz at bedside. CSW introduced self and purpose of visit. CSW and Fernando Ruiz discussed residential hospice. Fernando Ruiz expressed interested in [facility]. CSW and Fernando Ruiz dicussed the hospice process. CSW left a voice message regarding hospice referral for [name] at [facility]. CSW provided the [facility] with contact information for further questions. CSW will continue to follow this pt and assist with discharge as needed.   Assessment/plan status:  Psychosocial Support/Ongoing Assessment of Needs Other assessment/ plan:   Information/referral to community resources:    PATIENT'S/FAMILY'S RESPONSE TO PLAN OF CARE: Pt was sleep. Pt's wife presented with a sad affect, but calm mood. Pt's wife reported understaning the pt's current health status, thus leading her to selected residental hospice.    [place], MSW, [place]

## 2014-06-11 DIAGNOSIS — E872 Acidosis: Secondary | ICD-10-CM | POA: Insufficient documentation

## 2014-06-11 DIAGNOSIS — G20 Parkinson's disease: Secondary | ICD-10-CM | POA: Insufficient documentation

## 2014-06-11 DIAGNOSIS — E8729 Other acidosis: Secondary | ICD-10-CM | POA: Insufficient documentation

## 2014-06-11 DIAGNOSIS — A4181 Sepsis due to Enterococcus: Secondary | ICD-10-CM | POA: Insufficient documentation

## 2014-06-11 DIAGNOSIS — A419 Sepsis, unspecified organism: Secondary | ICD-10-CM

## 2014-06-11 DIAGNOSIS — F3130 Bipolar disorder, current episode depressed, mild or moderate severity, unspecified: Secondary | ICD-10-CM

## 2014-06-11 DIAGNOSIS — E038 Other specified hypothyroidism: Secondary | ICD-10-CM | POA: Insufficient documentation

## 2014-06-11 LAB — LEGIONELLA ANTIGEN, URINE

## 2014-06-11 LAB — RESPIRATORY VIRUS PANEL
ADENOVIRUS: NOT DETECTED
INFLUENZA A H1: NOT DETECTED
INFLUENZA A H3: NOT DETECTED
Influenza A: NOT DETECTED
Influenza B: NOT DETECTED
Metapneumovirus: NOT DETECTED
Parainfluenza 1: NOT DETECTED
Parainfluenza 2: NOT DETECTED
Parainfluenza 3: NOT DETECTED
RESPIRATORY SYNCYTIAL VIRUS A: NOT DETECTED
RESPIRATORY SYNCYTIAL VIRUS B: NOT DETECTED
RHINOVIRUS: NOT DETECTED

## 2014-06-11 LAB — URINE CULTURE

## 2014-06-11 MED ORDER — WHITE PETROLATUM GEL
Status: AC
  Filled 2014-06-11: qty 5

## 2014-06-11 NOTE — Progress Notes (Signed)
Patient's wife notified of transfer to [address].  All questions answered.

## 2014-06-11 NOTE — Progress Notes (Signed)
Received patient from [facility]. Oriented to unit and staff. Alert, on O2, VSS will continue to monitor.

## 2014-06-11 NOTE — Clinical Social Work Note (Signed)
CSW spoke with pt's wife Fernando Ruiz and son Fernando Ruiz . CSW explained to the family that [facility] not [facility] beds. CSW attempted to provided the family with an additional residential hospice list. The family declined the list. Fernando Ruiz reported Fernando Ruiz can not drive long distance. Fernando Ruiz stated he would like the pt to transition back [facility]. CSW explained to the family the relation that insurances (Medicare and Medicaid) plays when it comes to SNF hospice. CSW explained to the family that since the pt's does not have Medicaid the family will be responsible for room and board. Fernando Ruiz reported that he will call [facility] to discuss the financial aspect. CSW updated Yong ChannelAlicia Parker, NP and the case manager.   Alford Gamero, MSW, LCSWA [phone]

## 2014-06-11 NOTE — Progress Notes (Signed)
Mr. Fernando Ruiz is lying in bed, calm and resting. No family at bedside. He denies pain, anxiety, sleep disturbance and has no complaints. Spoke with CSW and she says there are no beds [facility]. [facility] but [facility]. CSW reported to me that family was upset about being forced to choose other than [facility] as Fernando Ruiz is unable to drive to other locations. I will attempt to speak with family this afternoon or tomorrow to help support.   Yong ChannelAlicia Garey Alleva, NP Palliative Medicine Team Pager # [phone] (M-F 8a-5p) Team Phone # [phone] (Nights/Weekends)

## 2014-06-11 NOTE — Plan of Care (Signed)
Problem: Consults Goal: Nutrition Consult-if indicated Outcome: Not Progressing Admitted for possible aspiration.  Patient now palliative care.  Swallow eval was ordered  Problem: Phase I Progression Outcomes Goal: Initial discharge plan identified Outcome: Progressing Possible [facility]

## 2014-06-11 NOTE — Progress Notes (Signed)
[facility] TEAM 1 - Stepdown/ICU TEAM Progress Note  Fernando Ruiz XHB:[id number] DOB: [birth year] DOA: 06/08/2014 PCP: Tivis Ringer, MD  Admit HPI / Brief Narrative: Fernando Ruiz is a 80 y.o. WM PMHx Parkinson's, bipolar disorder,dementia, hypothyroidism,resting tremor, and chronic kidney disease who presented to the ER w/ complaints of a choking episode. Patient had been admitted to the hospital 4 times since the end of November of this year. Most recent admission for acute on chronic renal failure secondary to dehydration from poor oral intake.  In the ER patient was found to have a white count of 27,400. Urinalysis was abnormal and consistent with possible UTI and dehydration. Lactic acid was elevated at 2.16 and his sodium was elevated at 150. Chest x-ray showed no active infiltrates.  Since admission urine culture has returned positive for enterococcus and sensitivities are pending. Patient has awakened and appears to be at his baseline. Palliative care has evaluated the patient and has had a discussion with the patient's family and at this time they wish to transition to full comfort and they would like for him to be at [facility] if at all possible. They feel at this point that he is suffering and they are prepared for "the end". As of 12/22 unclear when bed available at [facility] so plan is to transition to medical floor wait to see if that becomes available soon at [facility] versus sent back to [place] living under hospice care.  HPI/Subjective: Patient alert and pleasant. Confused.   Assessment/Plan:  Sepsis secondary to enterococcus urinary tract infection -Sepsis physiology has resolved -all antibiotics were discontinued in 12/21 given family request to focus on comfort  Anion gap metabolic acidosis -Bicarbonate infusion initially utilized but now comfort care so this medication was discontinued  Hypernatremia -Sodium had actually gone up as of 12/21 so dextrose  infusion increased 125 mL per hour with transition to comfort focus IV fluids have been discontinued   Acute on chronic kidney disease with metabolic acidosis  -Recurrent and likely related to underlying Parkinson's disease with dementia -Focus now on comfort -will not check any further lab data  Dementia -See above regarding comfort measures  History of bipolar disorder -Currently on IV Depakote-consider transition to oral since this medication could be considered a comfort medicine  Parkinson's -We will allow oral intake as comfort measure including medications -Begin dysphagia 1 diet  Hypothyroidism -TSH normal  Code Status: FULL Family Communication: no family present at time of exam Disposition Plan: Transfer to floor; discharge either to [facility] or [place] living skilled nursing facility on hospice  Consultants: Palliative medicine  Antibiotics: Aztreonam 12/19 > 12/20 Levofloxacin 12/19 Vancomycin 12/19  DVT prophylaxis: Comfort focus only   Objective: Blood pressure 157/78, pulse 87, temperature 98 F (36.7 C), temperature source Oral, resp. rate 21, height _0  (1.727 m), weight 144 lb 2.9 oz (65.4 kg), SpO2 94 %.  Intake/Output Summary (Last 24 hours) at 06/11/14 1412 Last data filed at 06/11/14 0700  Gross per 24 hour  Intake    175 ml  Output   1900 ml  Net  -1725 ml   Exam: General: Awake, confused but pleasant Lungs: Clear to auscultation bilaterally without wheezes or crackles Cardiovascular: Regular rate and rhythm, normal S1 and S2 Abdomen: Nontender, nondistended, soft, bowel sounds positive, no ascites, no appreciable mass Extremities: No cyanosis, clubbing, or edema bilateral lower extremities  Data Reviewed: Basic Metabolic Panel:  Recent Labs Lab 06/09/14 0050 06/09/14 0400 06/09/14 0736  06/09/14 1143 06/10/14 0255  NA 149* 148* 152* 153* 154*  K 4.5 4.9 4.7 5.0 4.8  CL 118* 118* 121* 121* 121*  CO2 16* 13* 16* 15* 17*   GLUCOSE 154* 88 114* 110* 128*  BUN 62* 64* 65* 66* 72*  CREATININE 3.82* 3.67* 3.68* 3.72* 3.67*  CALCIUM 8.6 8.8 8.6 8.6 8.7  MG  --   --   --   --  2.4   Liver Function Tests:  Recent Labs Lab 06/08/14 2006 06/10/14 0255  AST 27 21  ALT 38 30  ALKPHOS 128* 102  BILITOT 0.7 0.2*  PROT 7.7 6.5  ALBUMIN 2.5* 1.9*   CBC:  Recent Labs Lab 06/08/14 2006 06/09/14 0736 06/10/14 0255  WBC 27.4* 14.4* 12.8*  NEUTROABS 24.4* 12.7* 12.2*  HGB 12.9* 11.0* 10.3*  HCT 40.6 35.0* 33.2*  MCV 96.0 98.3 97.6  PLT 221 146* 162    Recent Results (from the past 240 hour(s))  Culture, blood (routine x 2)     Status: None (Preliminary result)   Collection Time: 06/08/14  8:06 PM  Result Value Ref Range Status   Specimen Description BLOOD RIGHT FOREARM  Final   Special Requests BOTTLES DRAWN AEROBIC AND ANAEROBIC 5CC EA  Final   Culture  Setup Time   Final    06/09/2014 04:14 Performed at [facility]    Culture   Final           BLOOD CULTURE RECEIVED NO GROWTH TO DATE CULTURE WILL BE HELD FOR 5 DAYS BEFORE ISSUING A FINAL NEGATIVE REPORT Performed at [facility]    Report Status PENDING  Incomplete  Culture, blood (routine x 2)     Status: None (Preliminary result)   Collection Time: 06/08/14  8:15 PM  Result Value Ref Range Status   Specimen Description BLOOD LEFT ARM  Final   Special Requests BOTTLES DRAWN AEROBIC ONLY 10CC  Final   Culture  Setup Time   Final    06/09/2014 04:14 Performed at [facility]    Culture   Final           BLOOD CULTURE RECEIVED NO GROWTH TO DATE CULTURE WILL BE HELD FOR 5 DAYS BEFORE ISSUING A FINAL NEGATIVE REPORT Performed at [facility]    Report Status PENDING  Incomplete  Urine culture     Status: None   Collection Time: 06/08/14  9:25 PM  Result Value Ref Range Status   Specimen Description URINE, CATHETERIZED  Final   Special Requests NONE  Final   Culture  Setup Time   Final    06/09/2014  05:45 Performed at [facility]   Final    >=100,000 COLONIES/ML Performed at [facility]    Culture   Final    ENTEROCOCCUS SPECIES Performed at [facility]    Report Status 06/11/2014 FINAL  Final   Organism ID, Bacteria ENTEROCOCCUS SPECIES  Final      Susceptibility   Enterococcus species - MIC*    AMPICILLIN <=2 SENSITIVE Sensitive     LEVOFLOXACIN 1 SENSITIVE Sensitive     NITROFURANTOIN <=16 SENSITIVE Sensitive     VANCOMYCIN 2 SENSITIVE Sensitive     TETRACYCLINE >=16 RESISTANT Resistant     * ENTEROCOCCUS SPECIES  MRSA PCR Screening     Status: None   Collection Time: 06/09/14 12:43 AM  Result Value Ref Range Status   MRSA by PCR NEGATIVE  NEGATIVE Final    Comment:        The GeneXpert MRSA Assay (FDA approved for NASAL specimens only), is one component of a comprehensive MRSA colonization surveillance program. It is not intended to diagnose MRSA infection nor to guide or monitor treatment for MRSA infections.   Respiratory virus panel (routine influenza)     Status: None   Collection Time: 06/09/14 11:36 PM  Result Value Ref Range Status   Source - RVPAN NASAL SWAB  Corrected    Comment: CORRECTED ON 12/22 AT 0117: PREVIOUSLY REPORTED AS NASAL SWAB   Respiratory Syncytial Virus A NOT DETECTED  Final   Respiratory Syncytial Virus B NOT DETECTED  Final   Influenza A NOT DETECTED  Final   Influenza B NOT DETECTED  Final   Parainfluenza 1 NOT DETECTED  Final   Parainfluenza 2 NOT DETECTED  Final   Parainfluenza 3 NOT DETECTED  Final   Metapneumovirus NOT DETECTED  Final   Rhinovirus NOT DETECTED  Final   Adenovirus NOT DETECTED  Final   Influenza A H1 NOT DETECTED  Final   Influenza A H3 NOT DETECTED  Final    Comment: (NOTE)       Normal Reference Range for each Analyte: NOT DETECTED Testing performed using the Luminex xTAG Respiratory Viral Panel test kit. The analytical performance characteristics of this  assay have been determined by [facility].  The modifications have not been cleared or approved by the FDA. This assay has been validated pursuant to the CLIA regulations and is used for clinical purposes. Performed at [facility]      Studies:  Recent x-ray studies have been reviewed in detail by the Attending Physician  Scheduled Meds:  Scheduled Meds: . valproate sodium  250 mg Intravenous Daily  . white petrolatum        Time spent on care of this patient: 35 mins   ELLIS,ALLISON L. , ANP   [facility] Office  [phone] Pager - [phone]  On-Call/Text Page:      [email or web address]      password [email or web address]  If 7PM-7AM, please contact night-coverage [email or web address] Password [email or web address] 06/11/2014, 2:12 PM   LOS: 3 days    Examined Patient and discussed A&P with ANP Ebony Hail and agree with above plan.  I have reviewed the entire database. I have made any necessary editorial changes, and agree with its content. Pt with Multiple Complex medical problems> 50 min spent in direct Pt care    Dia Crawford, MD [facility] [phone] pager

## 2014-06-12 DIAGNOSIS — F0391 Unspecified dementia with behavioral disturbance: Secondary | ICD-10-CM

## 2014-06-12 MED ORDER — BISACODYL 10 MG RE SUPP: 10.0000 mg | Freq: Every day | RECTAL | Status: AC | PRN

## 2014-06-12 MED ORDER — MORPHINE SULFATE 20 MG/5ML PO SOLN: 5.0000 mg | ORAL | Status: AC | PRN

## 2014-06-12 MED ORDER — ATROPINE SULFATE 1 % OP SOLN: 4.0000 [drp] | OPHTHALMIC | Status: AC | PRN

## 2014-06-12 NOTE — Discharge Summary (Signed)
Physician Discharge Summary  KAESYN JOHNSTON ZOX:[id number] DOB: Sep 23, 1930 DOA: 06/08/2014  PCP: Tivis Ringer, MD  Admit date: 06/08/2014 Discharge date: 06/12/2014  Time spent: 35 minutes  Recommendations for Outpatient Follow-up:  1. Patient to be transitioned to [place]  2. Plan for discharge to inpatient hospice  Discharge Diagnoses:  Principal Problem:   Sepsis Active Problems:   Cerebral artery occlusion with cerebral infarction   Secondary parkinsonism   Bipolar disorder   Hypothyroidism   Dementia   Hypernatremia   Failure to thrive in adult   Dehydration   Acute on chronic kidney failure   Metabolic acidosis, increased anion gap (IAG)   Palliative care encounter   Decreased oral intake   Sepsis due to enterococcus   High anion gap metabolic acidosis   Parkinson's disease   Other specified hypothyroidism   Discharge Condition: [facility]  Diet recommendation: As tolerated  Filed Weights   06/09/14 0010 06/09/14 0500 06/10/14 0500  Weight: 66.3 kg (146 lb 2.6 oz) 66.3 kg (146 lb 2.6 oz) 65.4 kg (144 lb 2.9 oz)    History of present illness:  Fernando Ruiz is a 80 y.o. male with Past medical history of dementia, hypothyroidism, bipolar disorder, chronic kidney disease, resting tremor, suspected parkinsonism. The patient presents with complaints of a choking episode. Patient recently admitted for acute on chronic renal failure secondary to prerenal etiology due to poor oral intake and was discharged to skilled nursing facility. In the nursing facility while he was eating supper he had a choking episode and after that developed fever associated with shortness of breath and increased work of breathing. Therefore he was brought here to the hospital. Patient at the time of my evaluation denied any acute complain of denied any signs of nausea. No diarrhea or vomiting reported in the ER. No active bleeding reported in the ER. No fall no trauma reported by  the nursing home.  Hospital Course:  KEELIN SHERIDAN is a 80 y.o. WM PMHx Parkinson's, bipolar disorder,dementia, hypothyroidism,resting tremor, and chronic kidney disease who presented to the ER w/ complaints of a choking episode. Patient had been admitted to the hospital 4 times since the end of November of this year. Most recent admission for acute on chronic renal failure secondary to dehydration from poor oral intake.  In the ER patient was found to have a white count of 27,400. Urinalysis was abnormal and consistent with possible UTI and dehydration. Lactic acid was elevated at 2.16 and his sodium was elevated at 150. Chest x-ray showed no active infiltrates.  Since admission urine culture has returned positive for enterococcus and sensitivities are pending. Patient has awakened and appears to be at his baseline. Palliative care has evaluated the patient and has had a discussion with the patient's family and at this time they wish to transition to full comfort and they would like for him to be at [facility] if at all possible. They feel at this point that he is suffering and they are prepared for "the end".   I spoke with his wife on 06/12/2014, confirmed goals of care. Plan to transfer patient to inpatient to inpatient hospice on 06/12/2014   Discharge Exam: Filed Vitals:   06/12/14 0556  BP: 125/71  Pulse: 80  Temp: 98.2 F (36.8 C)  Resp: 22    General: Minimally responsive, opens eyes to voice Lungs: Clear to auscultation bilaterally without wheezes or crackles Cardiovascular: Regular rate and rhythm, normal S1 and S2 Abdomen: Nontender,  nondistended, soft, bowel sounds positive, no ascites, no appreciable mass Extremities: No cyanosis, clubbing, or edema bilateral lower extremities  Discharge Instructions   Discharge Instructions    Call MD for:  severe uncontrolled pain    Complete by:  As directed      Diet - low sodium heart healthy    Complete by:  As directed       Increase activity slowly    Complete by:  As directed           Current Discharge Medication List    START taking these medications   Details  atropine 1 % ophthalmic solution Place 4 drops under the tongue every 4 (four) hours as needed (secretions). Qty: 2 mL, Refills: 12    bisacodyl (DULCOLAX) 10 MG suppository Place 1 suppository (10 mg total) rectally daily as needed for moderate constipation. Qty: 12 suppository, Refills: 0    morphine 20 MG/5ML solution Take 1.3 mLs (5.2 mg total) by mouth every 2 (two) hours as needed for pain. Qty: 100 mL, Refills: 0      CONTINUE these medications which have NOT CHANGED   Details  divalproex (DEPAKOTE ER) 250 MG 24 hr tablet Take 250 mg by mouth daily.     feeding supplement, ENSURE, (ENSURE) PUDG Take 1 Container by mouth 3 (three) times daily between meals. Refills: 0    levothyroxine (SYNTHROID, LEVOTHROID) 50 MCG tablet Take 50 mcg by mouth daily.     risperiDONE (RISPERDAL) 1 MG tablet Take 1 mg by mouth daily.       STOP taking these medications     amLODipine (NORVASC) 5 MG tablet      clonazePAM (KLONOPIN) 1 MG tablet      donepezil (ARICEPT) 10 MG tablet      finasteride (PROSCAR) 5 MG tablet      MYRBETRIQ 25 MG TB24 tablet      omeprazole (PRILOSEC) 40 MG capsule      simvastatin (ZOCOR) 20 MG tablet      sodium bicarbonate 650 MG tablet      trihexyphenidyl (ARTANE) 2 MG tablet        Allergies  Allergen Reactions  . Penicillins Rash      The results of significant diagnostics from this hospitalization (including imaging, microbiology, ancillary and laboratory) are listed below for reference.    Significant Diagnostic Studies: Ct Abdomen Pelvis Wo Contrast  05/18/2014   CLINICAL DATA:  Constipation for the past 3 days. Nausea and vomiting for the past 2 days. Worsening abdominal pain for the past 2 days.  EXAM: CT ABDOMEN AND PELVIS WITHOUT CONTRAST  TECHNIQUE: Multidetector CT imaging of the  abdomen and pelvis was performed following the standard protocol without IV contrast.  COMPARISON:  None.  FINDINGS: Small sliding hiatal hernia. Calcified granulomata in the liver. Unremarkable non contrasted appearance of the spleen, pancreas, adrenal glands, urinary bladder and prostate gland.  1.2 cm gallstone in the gallbladder. No gallbladder wall thickening or pericholecystic fluid. 1.2 cm mid left renal cyst or calyceal diverticulum with dependent calcific density. 6 mm hemorrhagic lower pole left renal cyst. Both kidneys are somewhat small.  Scattered colonic diverticula without evidence of diverticulitis. No enlarged lymph nodes. Atheromatous arterial calcifications. Normal amount of stool in the colon. Mild bilateral lower lobe cylindrical bronchiectasis. Lumbar and lower thoracic spine degenerative changes and mild scoliosis.  IMPRESSION: 1. No acute abnormality. 2. Small sliding hiatal hernia. 3. Cholelithiasis. 4. Mild bilateral lower lobe cylindrical bronchiectasis.  Electronically Signed   By: Enrique Sack M.D.   On: 05/18/2014 14:08   Dg Chest 2 View  05/19/2014   CLINICAL DATA:  Pneumonia  EXAM: CHEST  2 VIEW  COMPARISON:  01/31/2013  FINDINGS: Cardiomediastinal silhouette is stable. No acute infiltrate or pleural effusion. No pulmonary edema. Osteopenia and mild degenerative changes mid thoracic spine. Atherosclerotic calcifications of thoracic aorta.  IMPRESSION: No active cardiopulmonary disease.   Electronically Signed   By: Lahoma Crocker M.D.   On: 05/19/2014 16:44   Dg Abd 1 View  05/31/2014   CLINICAL DATA:  Failure to thrive.  Abdominal pain.  EXAM: ABDOMEN - 1 VIEW  COMPARISON:  05/18/2014  FINDINGS: Old healed bilateral rib fractures. Atherosclerosis noted. Bony demineralization.  Bowel gas pattern within normal limits. Scattered vascular calcifications noted.  IMPRESSION: 1. Bowel gas pattern within normal limits. 2. Atherosclerosis and scattered vascular calcifications. 3. Bony  demineralization.   Electronically Signed   By: Sherryl Barters M.D.   On: 05/31/2014 19:51   US Abdomen Complete  05/21/2014   CLINICAL DATA:  History of gallstones ; dialysis dependent chronic renal failure  EXAM: ULTRASOUND ABDOMEN COMPLETE  COMPARISON:  CT scan of the abdomen and pelvis of May 18 2014  FINDINGS: Gallbladder: The gallbladder is adequately distended. There is a 11 mm diameter mobile mobile echogenic shadowing stone. There is no gallbladder wall thickening, pericholecystic fluid, or positive sonographic Murphy's sign.  Common bile duct: Diameter: 4.3 mm  Liver: No focal lesion identified. Within normal limits in parenchymal echogenicity.  IVC: No abnormality visualized.  Pancreas: Visualized portion unremarkable.  Spleen: Size and appearance within normal limits.  Right Kidney: Length: 10.5 cm. There is cortical thinning diffusely with increased cortical echotexture.  Left Kidney: Length: 10 cm. There is cortical thinning diffusely and increased cortical echotexture. There is a 1.9 cm simple appearing upper pole cyst.  Abdominal aorta: No aneurysm visualized.  Other findings: None.  IMPRESSION: 1. Cholelithiasis without evidence of acute cholecystitis. 2. Changes within the kidneys consistent with chronic renal failure.   Electronically Signed   By: David  Martinique   On: 05/21/2014 08:13   Dg Esophagus  05/20/2014   CLINICAL DATA:  Vomiting.  Dysphagia.  ICD10: R 13.1.  Dementia.  EXAM: ESOPHAGUS/BARIUM SWALLOW/TABLET STUDY  COMPARISON:  Chest radiograph of 05/19/2014. Abdominal pelvic CT of 05/18/2014.  FINDINGS: Exam could not be performed. Patient was somnolent, and only mildly arousable. Attempt was made to have the patient swallow contrast, without success.  IMPRESSION: Unsuccessful attempt at esophagram. Given the clinical history of dementia and dysphasia, recommend speech pathology evaluation prior to attempting repeat esophagram. Esophagram should also be withheld until the  patient can follow directions.   Electronically Signed   By: Abigail Miyamoto M.D.   On: 05/20/2014 16:05   Dg Chest Port 1 View  (if Code Sepsis Called)  06/08/2014   CLINICAL DATA:  Fever and shortness of breath.  EXAM: PORTABLE CHEST - 1 VIEW  COMPARISON:  05/31/2014.  FINDINGS: Normal heart size with calcified tortuous aorta. Mild LEFT basilar atelectasis without focal infiltrate or effusion. Low lung volumes. Osteopenia. Similar appearance priors.  IMPRESSION: No definite active infiltrates.  Chronic changes as described.   Electronically Signed   By: Rolla Flatten M.D.   On: 06/08/2014 20:55   Dg Chest Port 1 View  05/31/2014   CLINICAL DATA:  Altered mental status and failure to thrive for 3 days.  EXAM: PORTABLE CHEST - 1 VIEW  COMPARISON:  05/22/2014  FINDINGS: The heart is borderline enlarged but stable. There is tortuosity and calcification of the thoracic aorta. Low lung volumes with vascular crowding and atelectasis. No definite infiltrates or effusions.  IMPRESSION: Low lung volumes with vascular crowding and atelectasis. No definite infiltrates or effusions.   Electronically Signed   By: Kalman Jewels M.D.   On: 05/31/2014 16:53   Dg Chest Port 1 View  05/22/2014   CLINICAL DATA:  Leukocytosis.  Weakness.  EXAM: PORTABLE CHEST - 1 VIEW  COMPARISON:  05/19/2014.  01/31/2013.  FINDINGS: Low volume chest. Probable superimposed interstitial pulmonary edema. Basilar atelectasis. Tortuous or ectatic thoracic aortic arch with atherosclerosis. Cardiopericardial silhouette is probably unchanged allowing for lower lung volumes.  No gross focal consolidation. Probable calcified lymph node in the RIGHT hilum.  IMPRESSION: Low volume chest with probable interstitial pulmonary edema.   Electronically Signed   By: Dereck Ligas M.D.   On: 05/22/2014 08:53    Microbiology: Recent Results (from the past 240 hour(s))  Culture, blood (routine x 2)     Status: None (Preliminary result)   Collection Time:  06/08/14  8:06 PM  Result Value Ref Range Status   Specimen Description BLOOD RIGHT FOREARM  Final   Special Requests BOTTLES DRAWN AEROBIC AND ANAEROBIC 5CC EA  Final   Culture  Setup Time   Final    06/09/2014 04:14 Performed at [facility]    Culture   Final           BLOOD CULTURE RECEIVED NO GROWTH TO DATE CULTURE WILL BE HELD FOR 5 DAYS BEFORE ISSUING A FINAL NEGATIVE REPORT Performed at [facility]    Report Status PENDING  Incomplete  Culture, blood (routine x 2)     Status: None (Preliminary result)   Collection Time: 06/08/14  8:15 PM  Result Value Ref Range Status   Specimen Description BLOOD LEFT ARM  Final   Special Requests BOTTLES DRAWN AEROBIC ONLY 10CC  Final   Culture  Setup Time   Final    06/09/2014 04:14 Performed at [facility]    Culture   Final           BLOOD CULTURE RECEIVED NO GROWTH TO DATE CULTURE WILL BE HELD FOR 5 DAYS BEFORE ISSUING A FINAL NEGATIVE REPORT Performed at [facility]    Report Status PENDING  Incomplete  Urine culture     Status: None   Collection Time: 06/08/14  9:25 PM  Result Value Ref Range Status   Specimen Description URINE, CATHETERIZED  Final   Special Requests NONE  Final   Culture  Setup Time   Final    06/09/2014 05:45 Performed at [place]   Final    >=100,000 COLONIES/ML Performed at [facility]    Culture   Final    ENTEROCOCCUS SPECIES Performed at [facility]    Report Status 06/11/2014 FINAL  Final   Organism ID, Bacteria ENTEROCOCCUS SPECIES  Final      Susceptibility   Enterococcus species - MIC*    AMPICILLIN <=2 SENSITIVE Sensitive     LEVOFLOXACIN 1 SENSITIVE Sensitive     NITROFURANTOIN <=16 SENSITIVE Sensitive     VANCOMYCIN 2 SENSITIVE Sensitive     TETRACYCLINE >=16 RESISTANT Resistant     * ENTEROCOCCUS SPECIES  MRSA PCR Screening     Status: None   Collection Time: 06/09/14 12:43 AM  Result Value Ref Range  Status   MRSA by PCR NEGATIVE NEGATIVE Final    Comment:        The GeneXpert MRSA Assay (FDA approved for NASAL specimens only), is one component of a comprehensive MRSA colonization surveillance program. It is not intended to diagnose MRSA infection nor to guide or monitor treatment for MRSA infections.   Respiratory virus panel (routine influenza)     Status: None   Collection Time: 06/09/14 11:36 PM  Result Value Ref Range Status   Source - RVPAN NASAL SWAB  Corrected    Comment: CORRECTED ON 12/22 AT 0117: PREVIOUSLY REPORTED AS NASAL SWAB   Respiratory Syncytial Virus A NOT DETECTED  Final   Respiratory Syncytial Virus B NOT DETECTED  Final   Influenza A NOT DETECTED  Final   Influenza B NOT DETECTED  Final   Parainfluenza 1 NOT DETECTED  Final   Parainfluenza 2 NOT DETECTED  Final   Parainfluenza 3 NOT DETECTED  Final   Metapneumovirus NOT DETECTED  Final   Rhinovirus NOT DETECTED  Final   Adenovirus NOT DETECTED  Final   Influenza A H1 NOT DETECTED  Final   Influenza A H3 NOT DETECTED  Final    Comment: (NOTE)       Normal Reference Range for each Analyte: NOT DETECTED Testing performed using the Luminex xTAG Respiratory Viral Panel test kit. The analytical performance characteristics of this assay have been determined by [facility].  The modifications have not been cleared or approved by the FDA. This assay has been validated pursuant to the CLIA regulations and is used for clinical purposes. Performed at [facility]: Basic Metabolic Panel:  Recent Labs Lab 06/09/14 0050 06/09/14 0400 06/09/14 0736 06/09/14 1143 06/10/14 0255  NA 149* 148* 152* 153* 154*  K 4.5 4.9 4.7 5.0 4.8  CL 118* 118* 121* 121* 121*  CO2 16* 13* 16* 15* 17*  GLUCOSE 154* 88 114* 110* 128*  BUN 62* 64* 65* 66* 72*  CREATININE 3.82* 3.67* 3.68* 3.72* 3.67*  CALCIUM 8.6 8.8 8.6 8.6 8.7  MG  --   --   --   --  2.4   Liver Function Tests:  Recent  Labs Lab 06/08/14 2006 06/10/14 0255  AST 27 21  ALT 38 30  ALKPHOS 128* 102  BILITOT 0.7 0.2*  PROT 7.7 6.5  ALBUMIN 2.5* 1.9*   No results for input(s): LIPASE, AMYLASE in the last 168 hours. No results for input(s): AMMONIA in the last 168 hours. CBC:  Recent Labs Lab 06/08/14 2006 06/09/14 0736 06/10/14 0255  WBC 27.4* 14.4* 12.8*  NEUTROABS 24.4* 12.7* 12.2*  HGB 12.9* 11.0* 10.3*  HCT 40.6 35.0* 33.2*  MCV 96.0 98.3 97.6  PLT 221 146* 162   Cardiac Enzymes: No results for input(s): CKTOTAL, CKMB, CKMBINDEX, TROPONINI in the last 168 hours. BNP: BNP (last 3 results) No results for input(s): PROBNP in the last 8760 hours. CBG: No results for input(s): GLUCAP in the last 168 hours.     SignedKelvin Cellar  [facility] 06/12/2014, 10:03 AM

## 2014-06-12 NOTE — Progress Notes (Signed)
IV's removed.  [facility]. Sherald BargeSpencer, Norvin Ohlin T

## 2014-06-12 NOTE — Clinical Social Work Note (Signed)
[facility] Anticipated discharge date:12/23 Family notified: patients wife- Craig Staggersleanor [employer] by [facility] [facility]  CSW signing off.  Merlyn LotJenna Holoman, LCSWA Clinical Social Worker [phone]

## 2014-06-12 NOTE — Progress Notes (Signed)
[facility].  All questions answered and instructed to call unit if has anymore questions.  [facility] called by CSW. Sherald BargeSpencer, Ernesteen Mihalic T

## 2014-06-15 LAB — CULTURE, BLOOD (ROUTINE X 2)
CULTURE: NO GROWTH
Culture: NO GROWTH

## 2014-06-21 DEATH — deceased

## 2014-06-24 ENCOUNTER — Ambulatory Visit: Payer: Medicare Other

## 2014-11-05 ENCOUNTER — Ambulatory Visit: Payer: Medicare Other | Admitting: Neurology

## 2014-11-06 ENCOUNTER — Ambulatory Visit: Payer: Medicare Other | Admitting: Neurology

## 2015-06-22 IMAGING — CT CT ABD-PELV W/O CM
2 of 4 series · 16 of 46 positions shown, 18 images · non-contrast
Comparison: None.

CLINICAL DATA: Constipation for the past 3 days. Nausea and
vomiting for the past 2 days. Worsening abdominal pain for the past
2 days.

EXAM:
CT ABDOMEN AND PELVIS WITHOUT CONTRAST
TECHNIQUE: Multidetector CT imaging of the abdomen and pelvis was performed
following the standard protocol without IV contrast.

[Series 2: abd/ pelvis 5.0 i30f 1 · axial · 0.79mm/px · z∈[-1054,-674]mm · 13 of 84 slices shown, 15 images]
[im 4/84  soft-tissue]
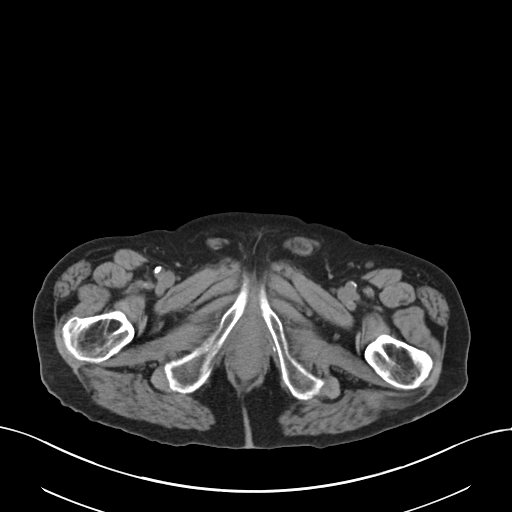
[im 4/84  bone]
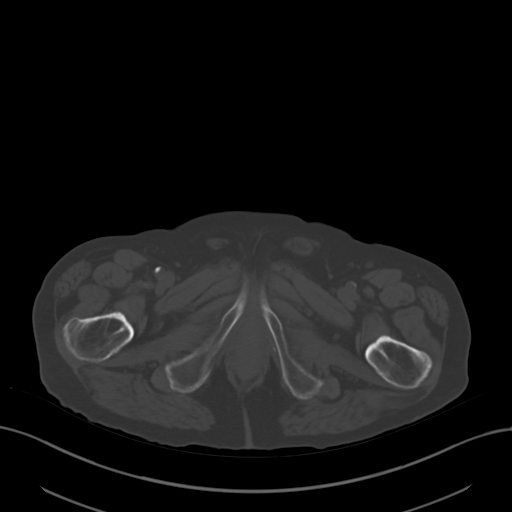
[im 11/84  soft-tissue]
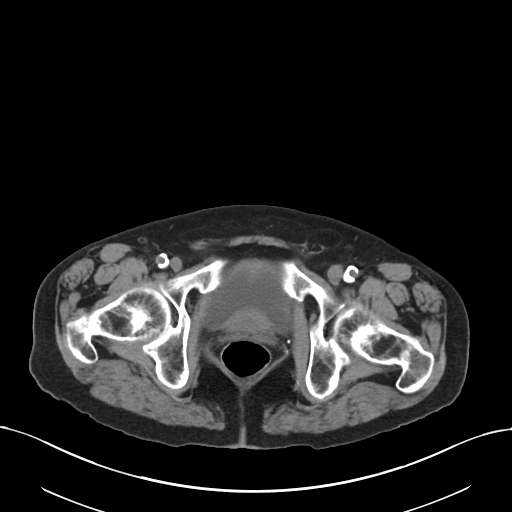
[im 19/84  soft-tissue]
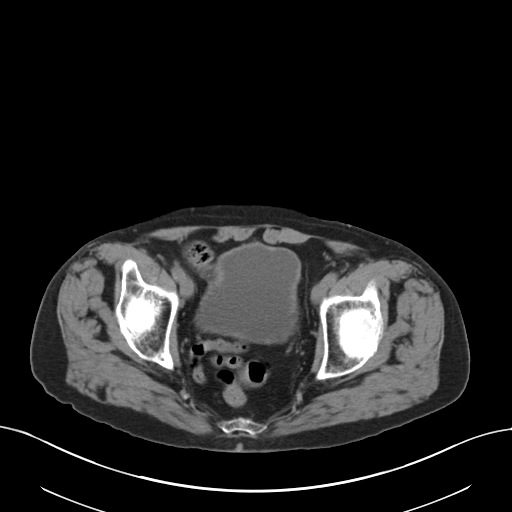
[im 22/84  soft-tissue]
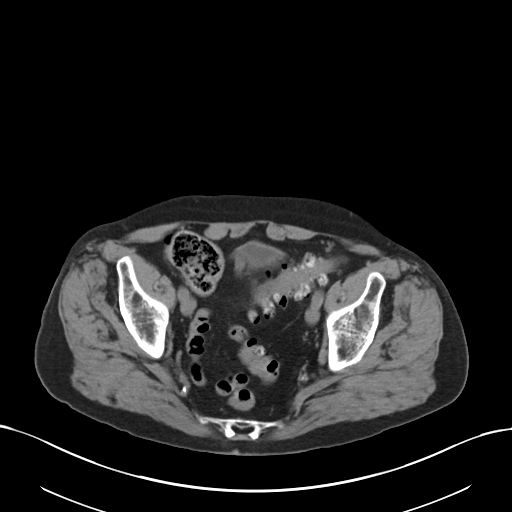
[im 29/84  soft-tissue]
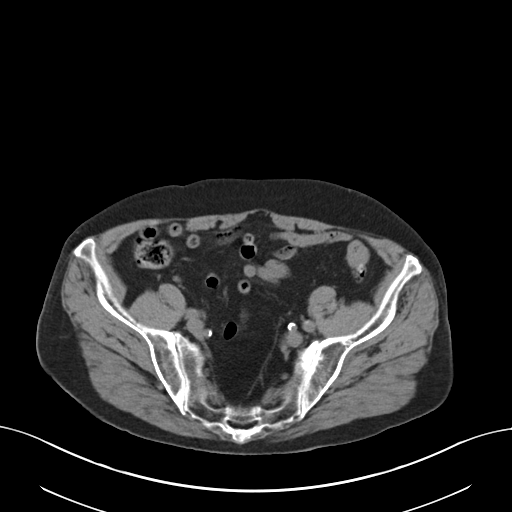
[im 37/84  soft-tissue]
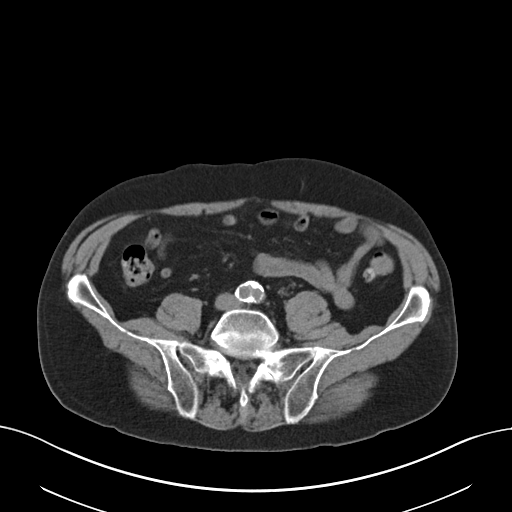
[im 44/84  soft-tissue]
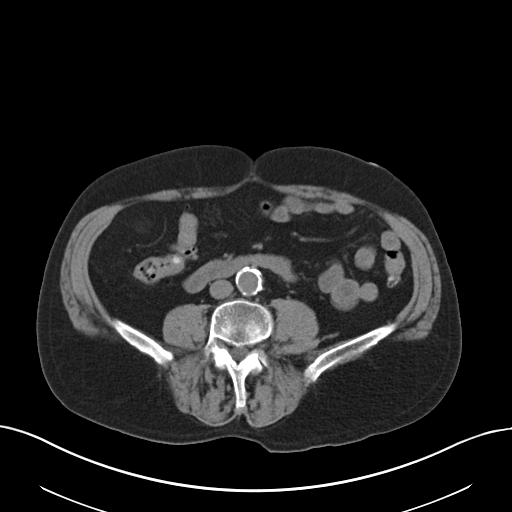
[im 47/84  soft-tissue]
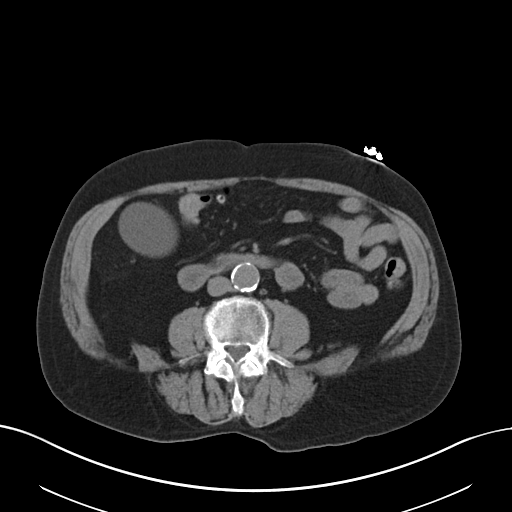
[im 55/84  soft-tissue]
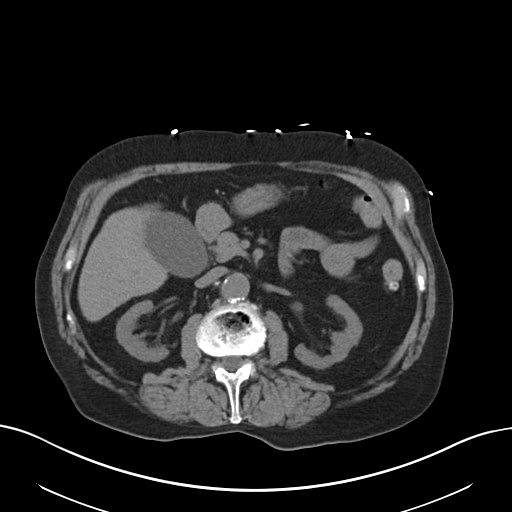
[im 55/84  bone]
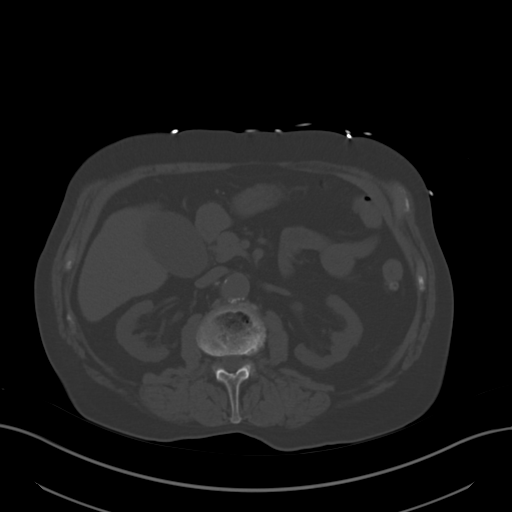
[im 62/84  soft-tissue]
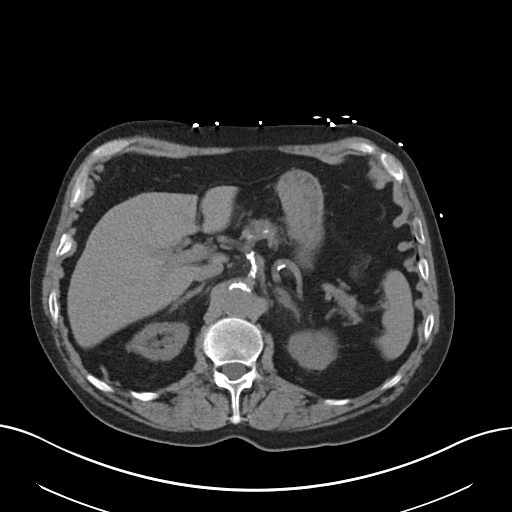
[im 65/84  soft-tissue]
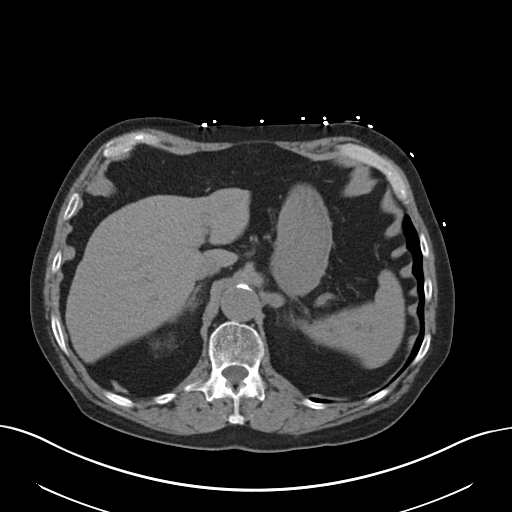
[im 73/84  soft-tissue]
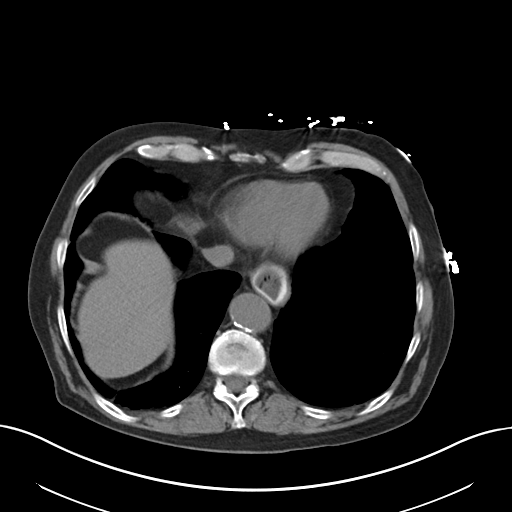
[im 80/84  soft-tissue]
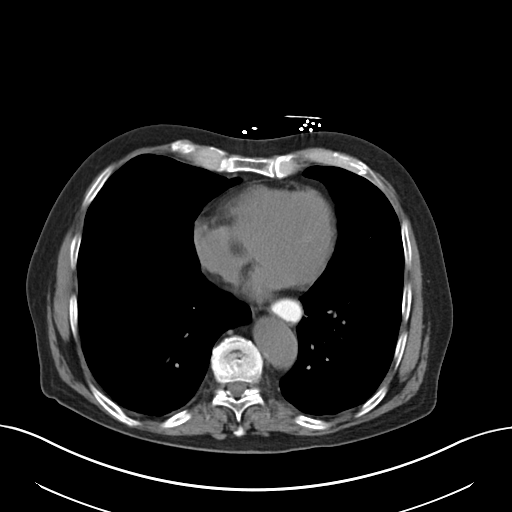

[Series 5: coronals · coronal · 0.70mm/px · 3 of 125 slices shown]
[im 42/125  soft-tissue]
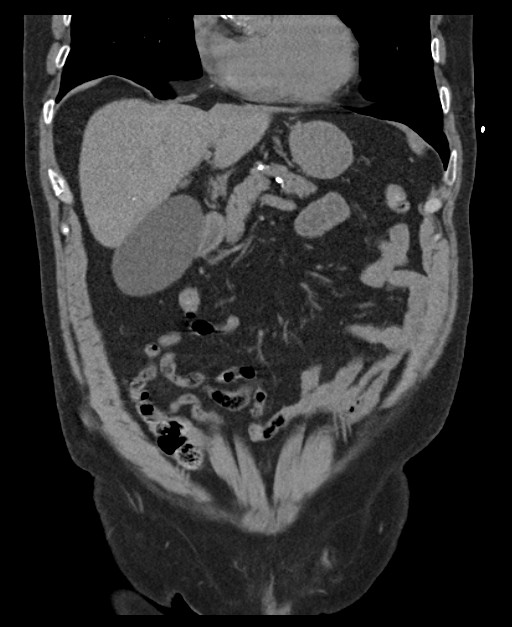
[im 56/125  soft-tissue]
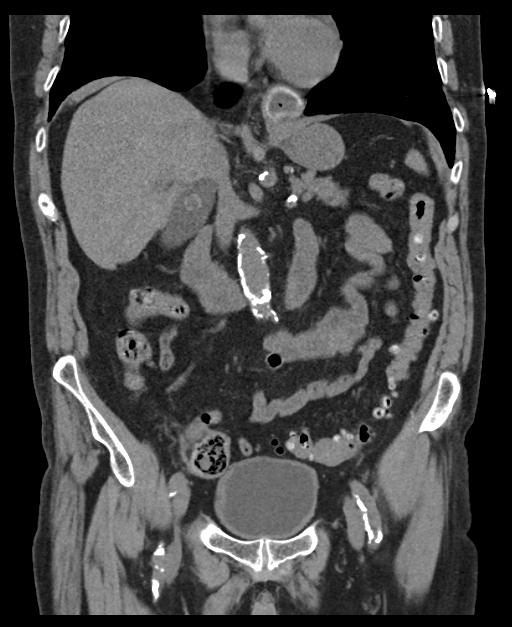
[im 69/125  soft-tissue]
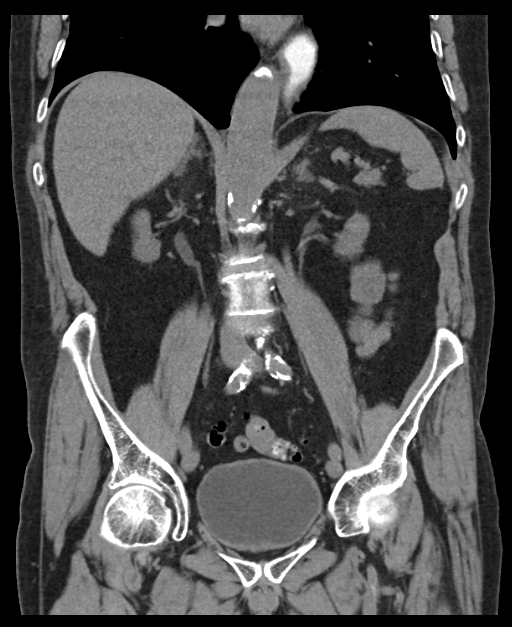

[16 of 46 positions shown; findings below may reference images not displayed]

FINDINGS: Small sliding hiatal hernia. Calcified granulomata in the liver.
Unremarkable non contrasted appearance of the spleen, pancreas,
adrenal glands, urinary bladder and prostate gland.

1.2 cm gallstone in the gallbladder. No gallbladder wall thickening
or pericholecystic fluid. 1.2 cm mid left renal cyst or calyceal
diverticulum with dependent calcific density. 6 mm hemorrhagic lower
pole left renal cyst. Both kidneys are somewhat small.

Scattered colonic diverticula without evidence of diverticulitis. No
enlarged lymph nodes. Atheromatous arterial calcifications. Normal
amount of stool in the colon. Mild bilateral lower lobe cylindrical
bronchiectasis. Lumbar and lower thoracic spine degenerative changes
and mild scoliosis.
IMPRESSION: 1. No acute abnormality.
2. Small sliding hiatal hernia.
3. Cholelithiasis.
4. Mild bilateral lower lobe cylindrical bronchiectasis.

## 2015-06-26 IMAGING — CR DG CHEST 1V PORT
1 series · 1 of 1 positions shown · non-contrast
Comparison: 05/19/2014.  01/31/2013.

CLINICAL DATA: Leukocytosis.  Weakness.

EXAM:
PORTABLE CHEST - 1 VIEW

[portable]
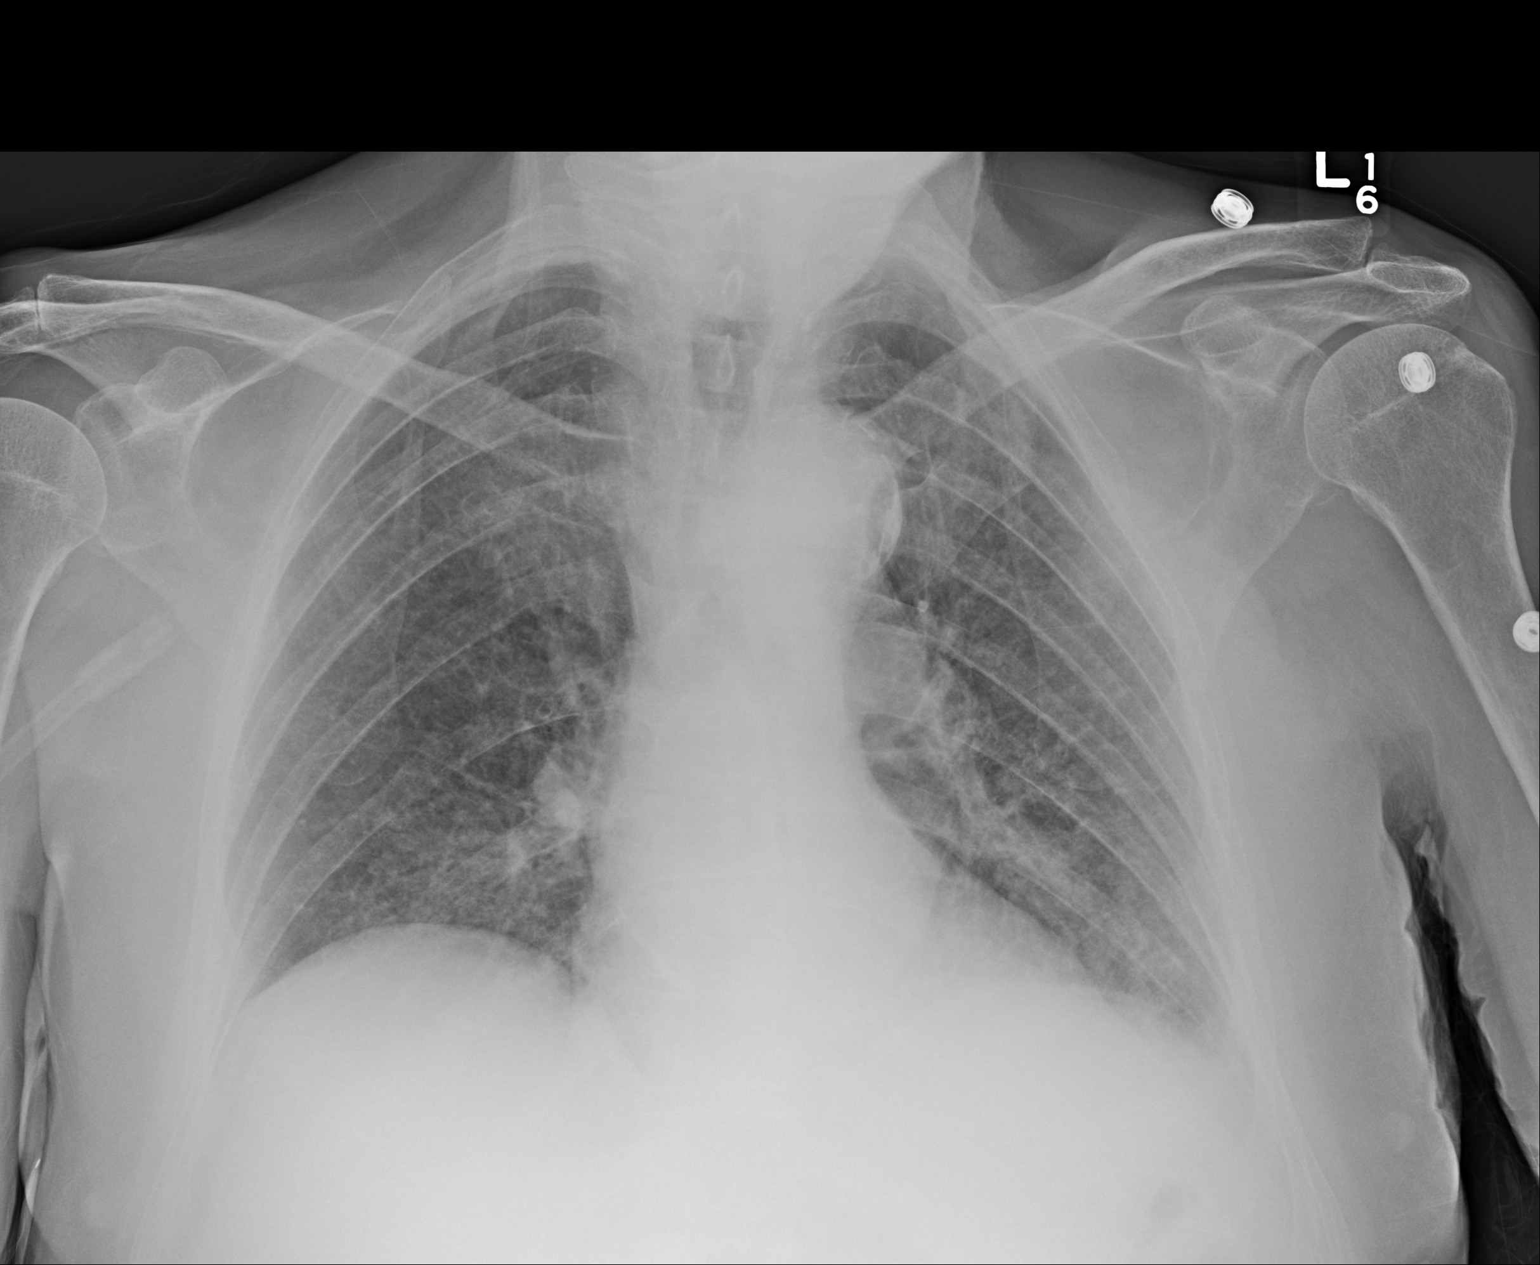

[1 of 1 positions shown; findings below may reference images not displayed]

FINDINGS: Low volume chest. Probable superimposed interstitial pulmonary
edema. Basilar atelectasis. Tortuous or ectatic thoracic aortic arch
with atherosclerosis. Cardiopericardial silhouette is probably
unchanged allowing for lower lung volumes.

No gross focal consolidation. Probable calcified lymph node in the
RIGHT hilum.
IMPRESSION: Low volume chest with probable interstitial pulmonary edema.

## 2015-07-05 IMAGING — CR DG CHEST 1V PORT
1 series · 1 of 1 positions shown · non-contrast
Comparison: 05/22/2014

CLINICAL DATA: Altered mental status and failure to thrive for 3
days.

EXAM:
PORTABLE CHEST - 1 VIEW

[AP]
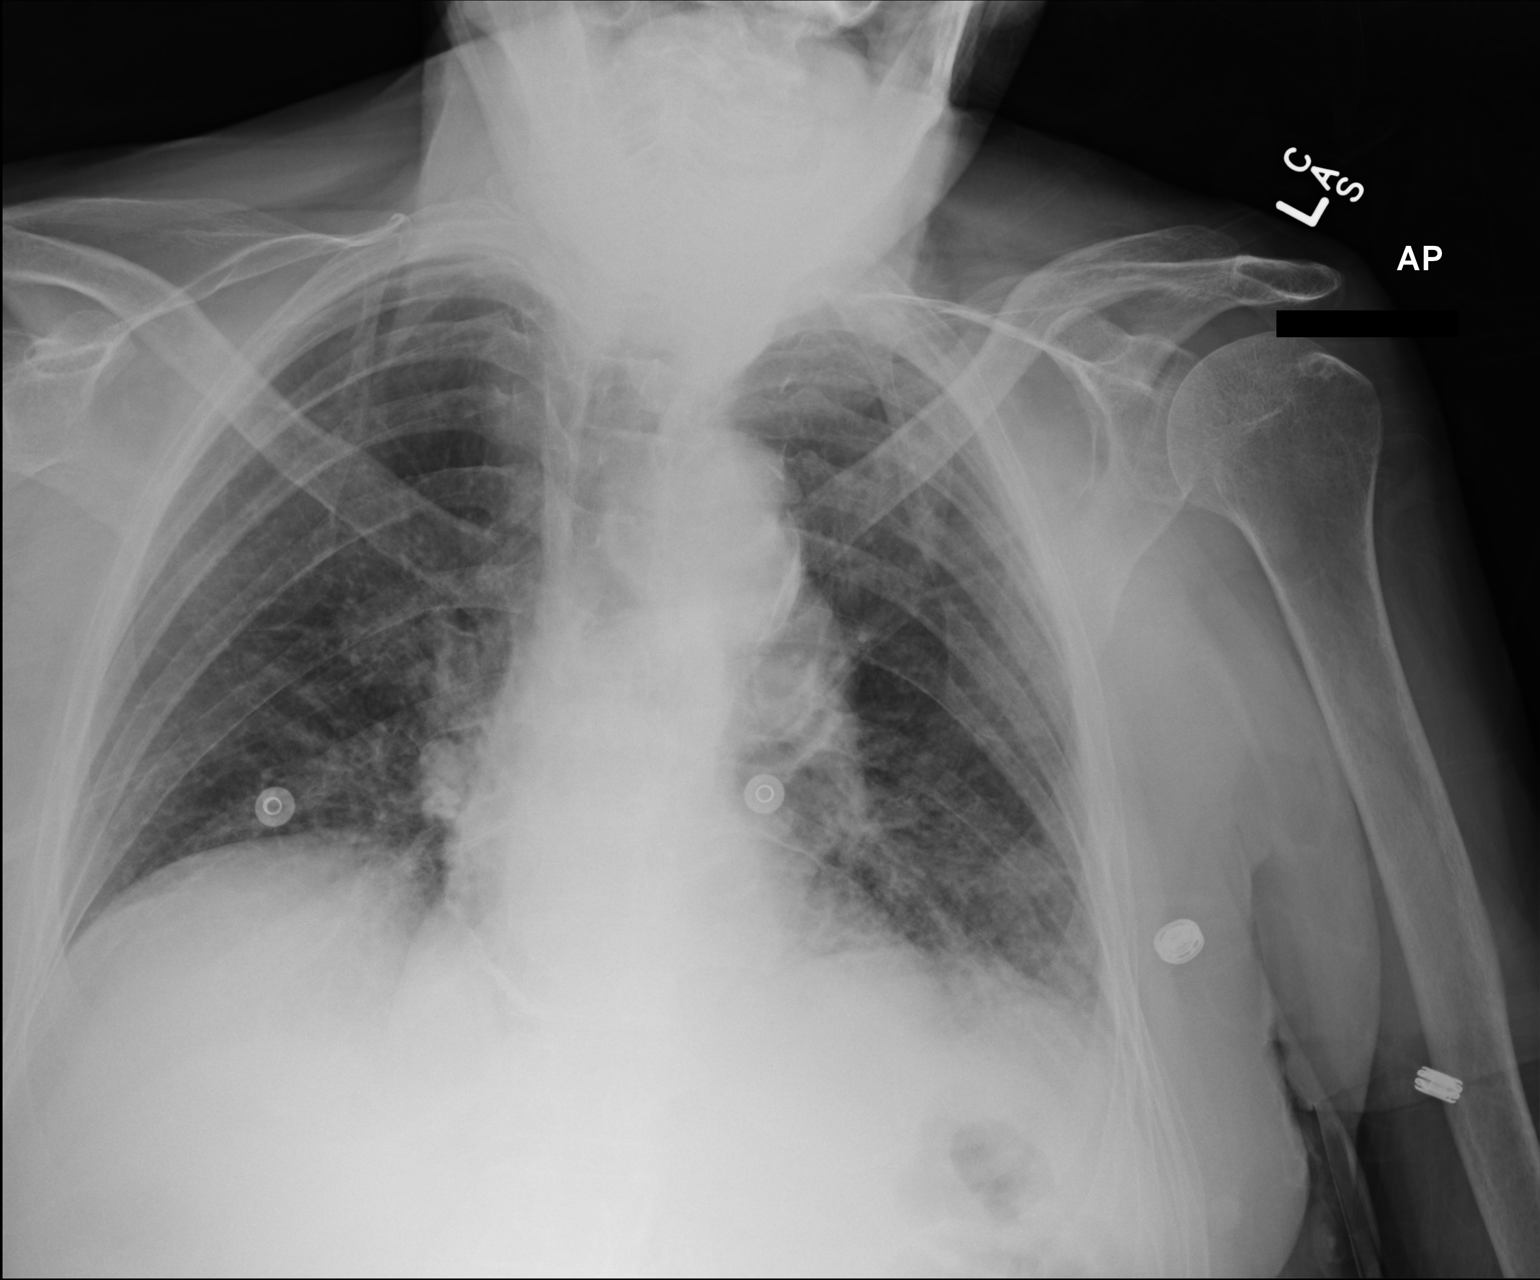

[1 of 1 positions shown; findings below may reference images not displayed]

FINDINGS: The heart is borderline enlarged but stable. There is tortuosity and
calcification of the thoracic aorta. Low lung volumes with vascular
crowding and atelectasis. No definite infiltrates or effusions.
IMPRESSION: Low lung volumes with vascular crowding and atelectasis. No definite
infiltrates or effusions.

## 2015-07-13 IMAGING — CR DG CHEST 1V PORT
1 series · 1 of 1 positions shown · non-contrast
Comparison: 05/31/2014.

CLINICAL DATA: Fever and shortness of breath.

EXAM:
PORTABLE CHEST - 1 VIEW

[AP]
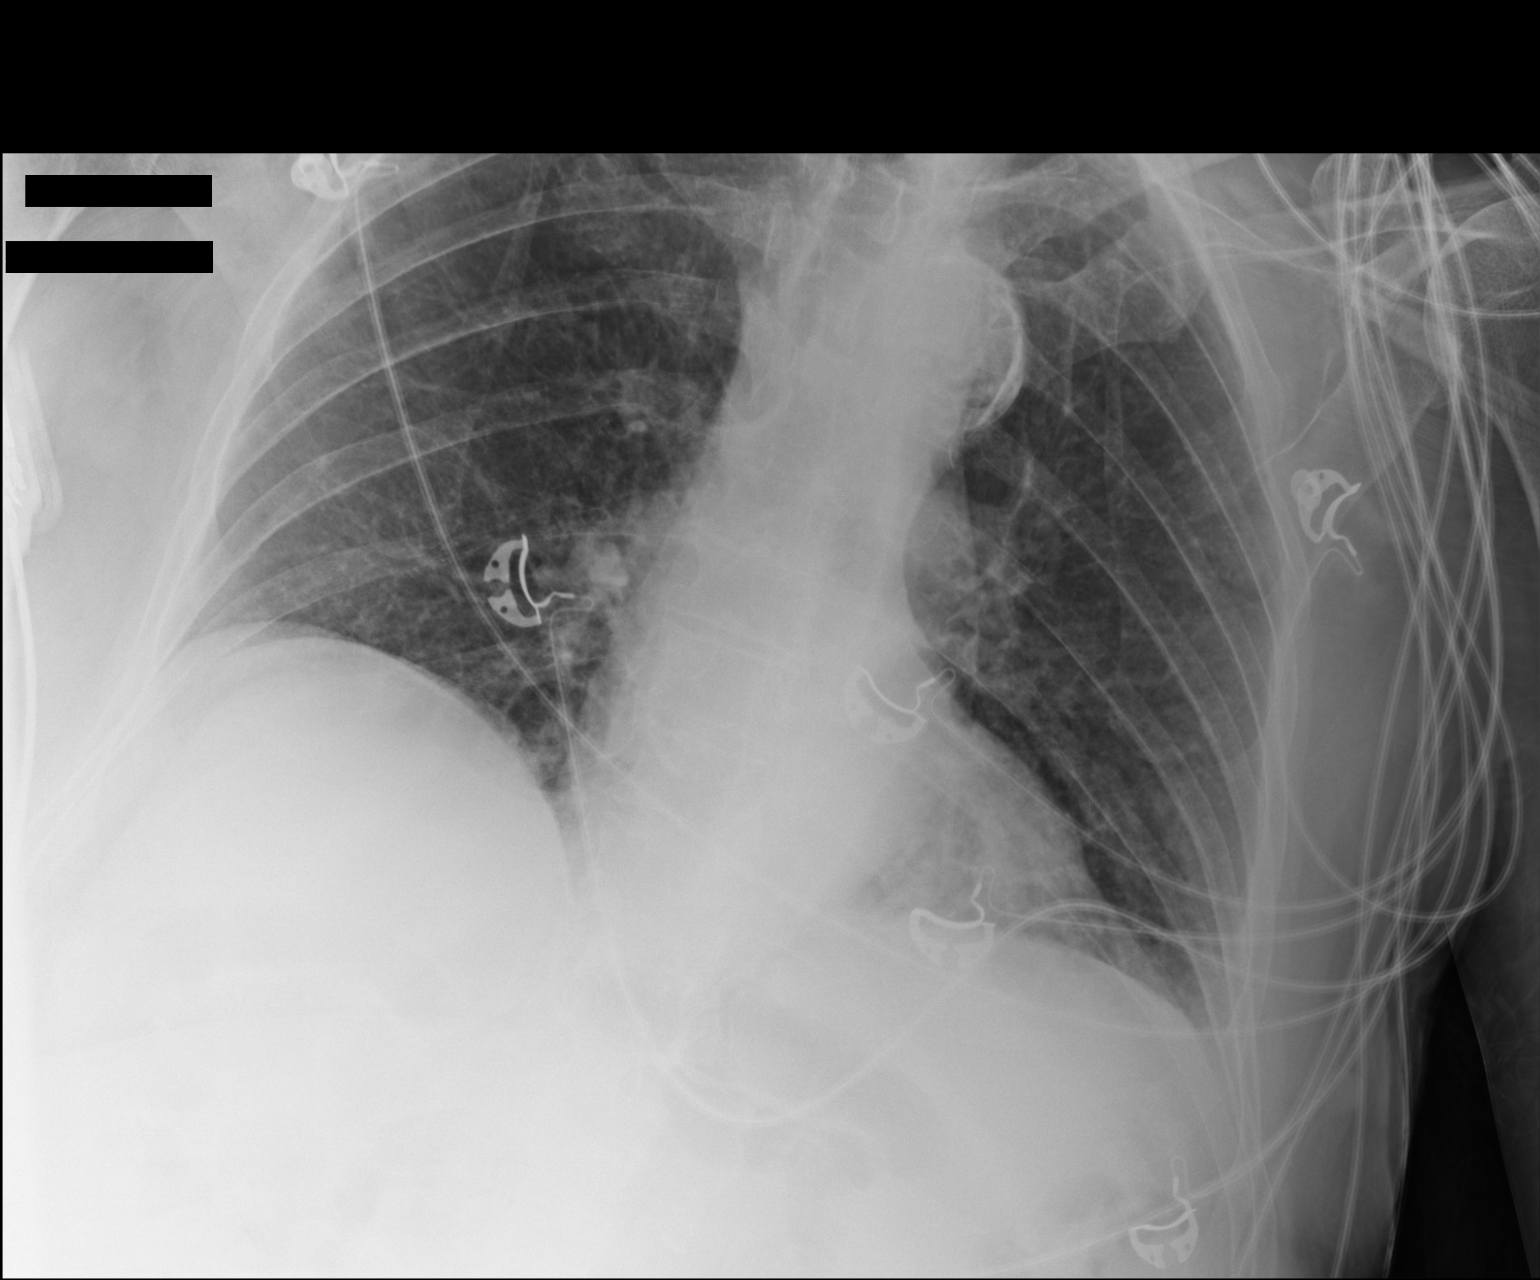

[1 of 1 positions shown; findings below may reference images not displayed]

FINDINGS: Normal heart size with calcified tortuous aorta. Mild LEFT basilar
atelectasis without focal infiltrate or effusion. Low lung volumes.
Osteopenia. Similar appearance priors.
IMPRESSION: No definite active infiltrates.  Chronic changes as described.
